# Patient Record
Sex: Female | Born: 1970 | Race: White | Hispanic: No | Smoking: Never smoker
Health system: Southern US, Community
[De-identification: ages and names within clinical notes are randomized; demographics above are authoritative.]

## PROBLEM LIST (undated history)

## (undated) DIAGNOSIS — F32A Depression, unspecified: Secondary | ICD-10-CM

## (undated) DIAGNOSIS — E079 Disorder of thyroid, unspecified: Secondary | ICD-10-CM

## (undated) DIAGNOSIS — G8929 Other chronic pain: Secondary | ICD-10-CM

## (undated) DIAGNOSIS — K219 Gastro-esophageal reflux disease without esophagitis: Secondary | ICD-10-CM

## (undated) DIAGNOSIS — M797 Fibromyalgia: Secondary | ICD-10-CM

## (undated) HISTORY — PX: TONSILLECTOMY AND ADENOIDECTOMY: SUR1326

## (undated) HISTORY — PX: TUBAL LIGATION: SHX77

## (undated) HISTORY — DX: Fibromyalgia: M79.7

## (undated) HISTORY — DX: Other chronic pain: G89.29

## (undated) HISTORY — DX: Depression, unspecified: F32.A

## (undated) HISTORY — DX: Disorder of thyroid, unspecified: E07.9

## (undated) HISTORY — PX: INNER EAR SURGERY: SHX679

## (undated) HISTORY — DX: Gastro-esophageal reflux disease without esophagitis: K21.9

---

## 2005-12-13 ENCOUNTER — Ambulatory Visit: Payer: Self-pay | Admitting: Pediatrics

## 2006-08-19 ENCOUNTER — Other Ambulatory Visit: Payer: Self-pay

## 2006-08-29 ENCOUNTER — Ambulatory Visit: Payer: Self-pay | Admitting: Unknown Physician Specialty

## 2006-09-22 ENCOUNTER — Ambulatory Visit: Payer: Self-pay | Admitting: Rheumatology

## 2008-11-21 ENCOUNTER — Ambulatory Visit: Payer: Self-pay | Admitting: Obstetrics and Gynecology

## 2012-02-26 DIAGNOSIS — G4700 Insomnia, unspecified: Secondary | ICD-10-CM | POA: Insufficient documentation

## 2012-02-26 DIAGNOSIS — M542 Cervicalgia: Secondary | ICD-10-CM

## 2012-02-26 DIAGNOSIS — G609 Hereditary and idiopathic neuropathy, unspecified: Secondary | ICD-10-CM | POA: Insufficient documentation

## 2012-02-26 DIAGNOSIS — G43909 Migraine, unspecified, not intractable, without status migrainosus: Secondary | ICD-10-CM

## 2012-02-26 HISTORY — DX: Migraine, unspecified, not intractable, without status migrainosus: G43.909

## 2012-02-26 HISTORY — DX: Insomnia, unspecified: G47.00

## 2012-02-26 HISTORY — DX: Cervicalgia: M54.2

## 2012-02-26 HISTORY — DX: Hereditary and idiopathic neuropathy, unspecified: G60.9

## 2014-03-03 DIAGNOSIS — I10 Essential (primary) hypertension: Secondary | ICD-10-CM | POA: Insufficient documentation

## 2014-03-03 HISTORY — DX: Essential (primary) hypertension: I10

## 2014-09-09 DIAGNOSIS — I639 Cerebral infarction, unspecified: Secondary | ICD-10-CM

## 2014-09-09 HISTORY — DX: Cerebral infarction, unspecified: I63.9

## 2019-12-23 ENCOUNTER — Encounter: Payer: Self-pay | Admitting: General Practice

## 2020-01-13 ENCOUNTER — Other Ambulatory Visit: Payer: Self-pay

## 2020-01-13 ENCOUNTER — Encounter: Payer: Self-pay | Admitting: Cardiology

## 2020-01-13 ENCOUNTER — Ambulatory Visit (INDEPENDENT_AMBULATORY_CARE_PROVIDER_SITE_OTHER): Payer: Medicaid Other | Admitting: Cardiology

## 2020-01-13 VITALS — BP 100/70 | HR 106 | Ht 62.5 in | Wt 160.0 lb

## 2020-01-13 DIAGNOSIS — I10 Essential (primary) hypertension: Secondary | ICD-10-CM | POA: Diagnosis not present

## 2020-01-13 DIAGNOSIS — R42 Dizziness and giddiness: Secondary | ICD-10-CM

## 2020-01-13 DIAGNOSIS — E0800 Diabetes mellitus due to underlying condition with hyperosmolarity without nonketotic hyperglycemic-hyperosmolar coma (NKHHC): Secondary | ICD-10-CM

## 2020-01-13 DIAGNOSIS — E119 Type 2 diabetes mellitus without complications: Secondary | ICD-10-CM | POA: Diagnosis not present

## 2020-01-13 DIAGNOSIS — R079 Chest pain, unspecified: Secondary | ICD-10-CM

## 2020-01-13 DIAGNOSIS — R0602 Shortness of breath: Secondary | ICD-10-CM

## 2020-01-13 DIAGNOSIS — E782 Mixed hyperlipidemia: Secondary | ICD-10-CM

## 2020-01-13 DIAGNOSIS — R002 Palpitations: Secondary | ICD-10-CM

## 2020-01-13 DIAGNOSIS — I200 Unstable angina: Secondary | ICD-10-CM | POA: Diagnosis not present

## 2020-01-13 HISTORY — DX: Mixed hyperlipidemia: E78.2

## 2020-01-13 HISTORY — DX: Dizziness and giddiness: R42

## 2020-01-13 HISTORY — DX: Diabetes mellitus due to underlying condition with hyperosmolarity without nonketotic hyperglycemic-hyperosmolar coma (NKHHC): E08.00

## 2020-01-13 HISTORY — DX: Chest pain, unspecified: R07.9

## 2020-01-13 HISTORY — DX: Palpitations: R00.2

## 2020-01-13 HISTORY — DX: Shortness of breath: R06.02

## 2020-01-13 MED ORDER — NITROGLYCERIN 0.4 MG SL SUBL
0.4000 mg | SUBLINGUAL_TABLET | SUBLINGUAL | 3 refills | Status: DC | PRN
Start: 2020-01-13 — End: 2021-03-02

## 2020-01-13 NOTE — Progress Notes (Signed)
Cardiology Office Note:    Date:  01/13/2020   ID:  Alexis Baker, DOB 08-12-1971, MRN [id number]  PCP:  [facility]  Cardiologist:  Thomasene Ripple, DO  Electrophysiologist:  None   Referring MD: [facility]*   The patient is referred by her PCP  History of Present Illness:    Alexis Baker is a 49 y.o. female with a hx of hypertension, diabetes, hyperlipidemia family history of coronary artery disease and anxiety.  She does have difficulty hearing.  Patient tells me that over the last 6 months she has been experiencing intermittent chest pain.  She described as a left-sided chest heaviness.  She notes that is usually about a 5 out of 10 at its worse.  At times it is tall and sometimes it feels achy.  She does feels that there are intermittent times where she feels some radiation to her upper shoulder not all the time.  She admits to associated shortness of breath with this. She also notes that recently she has some dizzy spells where she felt like she was going to pass out.  For this her PCP put a monitor on her which was sent back and the results has been at her PCPs office.  She noted that he states his feelings were associated with worsening palpitations.  She described the palpitations as an abrupt onset of fast heartbeat would last for few seconds.  Nothing made it better or worse.  Tells me her palpitations started back in 2007 when she had her son C-section and experience significant flash pulmonary edema.  Past Medical History:  Diagnosis Date  . Essential hypertension 03/03/2014  . Hereditary and idiopathic peripheral neuropathy 02/26/2012  . Insomnia 02/26/2012  . Migraine, unspecified, not intractable, without status migrainosus 02/26/2012  . Neck pain 02/26/2012    Past Surgical History:  Procedure Laterality Date  . INNER EAR SURGERY     Tubes placed in right  . TONSILLECTOMY AND ADENOIDECTOMY    . TUBAL LIGATION      Current  Medications: Current Meds  Medication Sig  . atorvastatin (LIPITOR) 80 MG tablet 80 mg daily.  . carboxymethylcellulose (REFRESH PLUS) 0.5 % SOLN 1 drop 3 times daily.  . Cholecalciferol (VITAMIN D3) 25 MCG (1000 UT) CAPS Take by mouth daily.  . clonazePAM (KLONOPIN) 1 MG tablet Take by mouth as needed.  . cyclobenzaprine (FLEXERIL) 10 MG tablet Take 10 mg by mouth at bedtime.  . DULoxetine (CYMBALTA) 30 MG capsule Take 30 mg by mouth daily.  Marland Kitchen glipiZIDE (GLUCOTROL XL) 5 MG 24 hr tablet 5 mg daily.  . hydrOXYzine (ATARAX/VISTARIL) 25 MG tablet 25 mg at bedtime.  . insulin NPH-regular Human (70-30) 100 UNIT/ML injection Inject into the skin.  Marland Kitchen levothyroxine (SYNTHROID) 25 MCG tablet 25 mcg daily.  Marland Kitchen losartan (COZAAR) 50 MG tablet 50 mg daily.  . metFORMIN (GLUCOPHAGE-XR) 500 MG 24 hr tablet 1,000 mg in the morning and at bedtime.  . mometasone (NASONEX) 50 MCG/ACT nasal spray Place 2 sprays into the nose daily.  . nortriptyline (PAMELOR) 50 MG capsule 100 mg at bedtime.  Marland Kitchen omeprazole (PRILOSEC) 40 MG capsule 40 mg as needed.  . sertraline (ZOLOFT) 100 MG tablet Take 100 mg by mouth daily.  . traZODone (DESYREL) 50 MG tablet Take 100 mg by mouth at bedtime.     Allergies:   Penicillins   Social History   Socioeconomic History  . Marital status: Married  Spouse name: Not on file  . Number of children: Not on file  . Years of education: Not on file  . Highest education level: Not on file  Occupational History  . Not on file  Tobacco Use  . Smoking status: Never Smoker  . Smokeless tobacco: Never Used  Substance and Sexual Activity  . Alcohol use: Never  . Drug use: Never  . Sexual activity: Not on file  Other Topics Concern  . Not on file  Social History Narrative  . Not on file   Social Determinants of Health   Financial Resource Strain:   . Difficulty of Paying Living Expenses:   Food Insecurity:   . Worried About Programme researcher, broadcasting/film/video in the Last Year:   . Garment/textile technologist in the Last Year:   Transportation Needs:   . Freight forwarder (Medical):   Marland Kitchen Lack of Transportation (Non-Medical):   Physical Activity:   . Days of Exercise per Week:   . Minutes of Exercise per Session:   Stress:   . Feeling of Stress :   Social Connections:   . Frequency of Communication with Friends and Family:   . Frequency of Social Gatherings with Friends and Family:   . Attends Religious Services:   . Active Member of Clubs or Organizations:   . Attends Banker Meetings:   Marland Kitchen Marital Status:      Family History: The patient's family history includes Congestive Heart Failure in her brother; Dementia in her paternal grandmother; Diabetes in her brother, father, maternal grandmother, and paternal grandmother; Heart attack in her maternal grandmother; Heart disease in her father.  ROS:   Review of Systems  Constitution: Negative for decreased appetite, fever and weight gain.  HENT: Negative for congestion, ear discharge, hoarse voice and sore throat.   Eyes: Negative for discharge, redness, vision loss in right eye and visual halos.  Cardiovascular: Reports chest pain and dyspnea on exertion.  Negative for leg swelling, orthopnea and palpitations.  Respiratory: Negative for cough, hemoptysis, shortness of breath and snoring.   Endocrine: Negative for heat intolerance and polyphagia.  Hematologic/Lymphatic: Negative for bleeding problem. Does not bruise/bleed easily.  Skin: Negative for flushing, nail changes, rash and suspicious lesions.  Musculoskeletal: Negative for arthritis, joint pain, muscle cramps, myalgias, neck pain and stiffness.  Gastrointestinal: Negative for abdominal pain, bowel incontinence, diarrhea and excessive appetite.  Genitourinary: Negative for decreased libido, genital sores and incomplete emptying.  Neurological: Negative for brief paralysis, focal weakness, headaches and loss of balance.  Psychiatric/Behavioral: Negative for  altered mental status, depression and suicidal ideas.  Allergic/Immunologic: Negative for HIV exposure and persistent infections.    EKGs/Labs/Other Studies Reviewed:    The following studies were reviewed today:   EKG:  The ekg ordered today demonstrates sinus tachycardia, heart rate 102 bpm.  Recent Labs: No results found for requested labs within last 8760 hours.  Recent Lipid Panel No results found for: CHOL, TRIG, HDL, CHOLHDL, VLDL, LDLCALC, LDLDIRECT  Physical Exam:    VS:  BP 100/70 (BP Location: Left Arm, Patient Position: Sitting, Cuff Size: Normal)   Pulse (!) 106   Ht 5' 2.5" (1.588 m)   Wt 160 lb (72.6 kg)   SpO2 97%   BMI 28.80 kg/m     Wt Readings from Last 3 Encounters:  01/13/20 160 lb (72.6 kg)     GEN: Well nourished, well developed in no acute distress HEENT: Normal NECK: No JVD; No carotid  bruits LYMPHATICS: No lymphadenopathy CARDIAC: S1S2 noted,RRR, no murmurs, rubs, gallops RESPIRATORY:  Clear to auscultation without rales, wheezing or rhonchi  ABDOMEN: Soft, non-tender, non-distended, +bowel sounds, no guarding. EXTREMITIES: No edema, No cyanosis, no clubbing MUSCULOSKELETAL:  No deformity  SKIN: Warm and dry NEUROLOGIC:  Alert and oriented x 3, non-focal PSYCHIATRIC:  Normal affect, good insight  ASSESSMENT:    1. Essential hypertension   2. Mixed hyperlipidemia   3. Diabetes mellitus due to underlying condition with hyperosmolarity without coma, without long-term current use of insulin (HCC)   4. Chest pain of uncertain etiology   5. Shortness of breath   6. Palpitations   7. Dizziness    PLAN:    Her chest pain is concerning given her risk factors I like to pursue an ischemic evaluation.  This patient she is agreeable to proceed with a pharmacologic nuclear stress test.  Have educated patient about this test and she is willing to proceed.  In addition, sublingual nitroglycerin prescription was sent, its protocol and 911 protocol  explained and the patient vocalized understanding questions were answered to the patient's satisfaction.  She does also have shortness of breath on exertion.  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  Hypertension-no need to change any medications pressures acceptable in the office.  Diabetes mellitus-her diabetes is being managed by her PCP.  Palpitations and dizziness-she recently wore monitor unfortunately we have asked to have copy sent while the patient is in her visit but we have not received any other fax yet.  At the very end of the visit the patient started experience significant chest pain, therefore EMS were called and patient will be transported to the [facility] emergency department-I like it to be kept overnight and if her troponins are negative x3 will do a nuclear stress test while she is admitted.  The patient is in agreement with the above plan. The patient left the office in stable condition.  The patient will follow up in 3 months or sooner if needed.   Medication Adjustments/Labs and Tests Ordered: Current medicines are reviewed at length with the patient today.  Concerns regarding medicines are outlined above.  Orders Placed This Encounter  Procedures  . Lipid Profile  . Myocardial Perfusion Imaging  . EKG 12-Lead  . ECHOCARDIOGRAM COMPLETE   Meds ordered this encounter  Medications  . nitroGLYCERIN (NITROSTAT) 0.4 MG SL tablet    Sig: Place 1 tablet (0.4 mg total) under the tongue every 5 (five) minutes as needed for chest pain.    Dispense:  90 tablet    Refill:  3    Patient Instructions  Medication Instructions:  Pick up Nitro  *If you need a refill on your cardiac medications before your next appointment, please call your pharmacy*   Lab Work: Lipids... fasting tomorrow  If you have labs (blood  work) drawn today and your tests are completely normal, you will receive your results only by: Marland Kitchen MyChart Message (if you have MyChart) OR . A paper copy in the mail If you have any lab test that is abnormal or we need to change your treatment, we will call you to review the results.   Testing/Procedures: Your physician has requested that you have an echocardiogram. Echocardiography is a painless test that uses sound waves to create images of your heart. It provides your doctor with information about  the size and shape of your heart and how well your heart's chambers and valves are working. This procedure takes approximately one hour. There are no restrictions for this procedure.  .        Follow-Up: At [facility], you and your health needs are our priority.  As part of our continuing mission to provide you with exceptional heart care, we have created designated Provider Care Teams.  These Care Teams include your primary Cardiologist (physician) and Advanced Practice Providers (APPs -  Physician Assistants and Nurse Practitioners) who all work together to provide you with the care you need, when you need it.  We recommend signing up for the patient portal called "MyChart".  Sign up information is provided on this After Visit Summary.  MyChart is used to connect with patients for Virtual Visits (Telemedicine).  Patients are able to view lab/test results, encounter notes, upcoming appointments, etc.  Non-urgent messages can be sent to your provider as well.   To learn more about what you can do with MyChart, go to [email or web address].    Your next appointment:   3 month(s)  The format for your next appointment:   In Person  Provider:   Berniece Salines, DO   Other Instructions      Adopting a Healthy Lifestyle.  Know what a healthy weight is for you (roughly BMI <25) and aim to maintain this   Aim for 7+ servings of fruits and vegetables daily   65-80+ fluid ounces of water  or unsweet tea for healthy kidneys   Limit to max 1 drink of alcohol per day; avoid smoking/tobacco   Limit animal fats in diet for cholesterol and heart health - choose grass fed whenever available   Avoid highly processed foods, and foods high in saturated/trans fats   Aim for low stress - take time to unwind and care for your mental health   Aim for 150 min of moderate intensity exercise weekly for heart health, and weights twice weekly for bone health   Aim for 7-9 hours of sleep daily   When it comes to diets, agreement about the perfect plan isnt easy to find, even among the experts. Experts at the [employer] developed an idea known as the Healthy Eating Plate. Just imagine a plate divided into logical, healthy portions.   The emphasis is on diet quality:   Load up on vegetables and fruits - one-half of your plate: Aim for color and variety, and remember that potatoes dont count.   Go for whole grains - one-quarter of your plate: Whole wheat, barley, wheat berries, quinoa, oats, brown rice, and foods made with them. If you want pasta, go with whole wheat pasta.   Protein power - one-quarter of your plate: Fish, chicken, beans, and nuts are all healthy, versatile protein sources. Limit red meat.   The diet, however, does go beyond the plate, offering a few other suggestions.   Use healthy plant oils, such as olive, canola, soy, corn, sunflower and peanut. Check the labels, and avoid partially hydrogenated oil, which have unhealthy trans fats.   If youre thirsty, drink water. Coffee and tea are good in moderation, but skip sugary drinks and limit milk and dairy products to one or two daily servings.   The type of carbohydrate in the diet is more important than the amount. Some sources of carbohydrates, such as vegetables, fruits, whole grains, and beans-are healthier than others.   Finally, stay active  Signed,  Thomasene RippleKardie Lavin Petteway, DO  01/13/2020 4:44 PM      [facility]

## 2020-01-14 DIAGNOSIS — I200 Unstable angina: Secondary | ICD-10-CM | POA: Diagnosis not present

## 2020-01-14 DIAGNOSIS — E119 Type 2 diabetes mellitus without complications: Secondary | ICD-10-CM | POA: Diagnosis not present

## 2020-01-14 DIAGNOSIS — R079 Chest pain, unspecified: Secondary | ICD-10-CM | POA: Diagnosis not present

## 2020-01-14 DIAGNOSIS — I10 Essential (primary) hypertension: Secondary | ICD-10-CM | POA: Diagnosis not present

## 2020-02-15 ENCOUNTER — Ambulatory Visit: Payer: Medicaid Other | Admitting: Cardiology

## 2020-09-04 DIAGNOSIS — U071 COVID-19: Secondary | ICD-10-CM | POA: Insufficient documentation

## 2020-09-04 DIAGNOSIS — E669 Obesity, unspecified: Secondary | ICD-10-CM | POA: Insufficient documentation

## 2020-09-04 DIAGNOSIS — R0902 Hypoxemia: Secondary | ICD-10-CM

## 2020-09-04 HISTORY — DX: Hypoxemia: R09.02

## 2020-09-04 HISTORY — DX: Obesity, unspecified: E66.9

## 2020-09-04 HISTORY — DX: COVID-19: U07.1

## 2021-01-31 ENCOUNTER — Ambulatory Visit: Payer: Medicaid Other | Admitting: Internal Medicine

## 2021-01-31 ENCOUNTER — Other Ambulatory Visit (HOSPITAL_COMMUNITY): Payer: Self-pay

## 2021-02-15 ENCOUNTER — Ambulatory Visit (INDEPENDENT_AMBULATORY_CARE_PROVIDER_SITE_OTHER): Payer: Medicaid Other | Admitting: Internal Medicine

## 2021-02-15 ENCOUNTER — Encounter: Payer: Self-pay | Admitting: Internal Medicine

## 2021-02-15 ENCOUNTER — Other Ambulatory Visit: Payer: Self-pay

## 2021-02-15 VITALS — BP 136/86 | HR 87 | Resp 16 | Ht 62.5 in | Wt 158.0 lb

## 2021-02-15 DIAGNOSIS — J329 Chronic sinusitis, unspecified: Secondary | ICD-10-CM | POA: Diagnosis not present

## 2021-02-15 NOTE — Progress Notes (Signed)
[facility]  Reason for Consult: "chronic infection" Referring Provider: Adela Glimpse    Patient Active Problem List   Diagnosis Date Noted   Mixed hyperlipidemia 01/13/2020   Diabetes mellitus due to underlying condition with hyperosmolarity without coma, without long-term current use of insulin (HCC) 01/13/2020   Chest pain of uncertain etiology 01/13/2020   Palpitations 01/13/2020   Dizziness 01/13/2020   Shortness of breath 01/13/2020   Essential hypertension 03/03/2014   Hereditary and idiopathic peripheral neuropathy 02/26/2012   Insomnia 02/26/2012   Migraine, unspecified, not intractable, without status migrainosus 02/26/2012   Neck pain 02/26/2012      HPI: Alexis Baker is a 50 y.o. female allergic rhinitis, hx covid infection, hlp, dm2, migraine, depression, referred by her pcp for chronic infection  I reviewed Tonya's (her pcp note), and main ID issue was chronic diarrhea that had negative cdiff pcr Today 02/15/2021 patient is not referring about any diarrhea. Last time she has diarrhea was a week prior to this visit. Had had diarrhea since covid infection and described it as soft/broken up/watery. She does have solid stools in between  She is focusing today on recurrent sinus infection since 1980. I asked her to describe an episode: Headache between eyes, facial pain in the cheeks/upper teeth, nasal discharge "all different colors", ears pressure, most of the time with fever 100.3s to sometimes 101s  She would have an episode every 2-3 months  An episode would last about 2 months, then the next 3-4 weeks she would get another one  She had seen ENT and her PCP for this She has tympanostomy on the right side; previously left side too   Patient has a 79 year old kid  Pretty much each time they would give her antibiotics Patient hasn't taken any antibiotics since covid.   She used to use ocean spray She doesn't recall using  anticholinergic or steroid inhaler  She had seen allergy specialty before but said she would get sinus symptoms with each injection. She had allergy testing before as well and is allergic to dog/cats as well. Currently not seeing them    She had covid infection 08/2020 and was admitted for 3 days put on oxygen. She has sequela post covid infection and has chronic wheezing/short of breath/dry cough and is following a pulmonologist. Not needing chronic oxygen. Using 2 inhalers   No history of pneumonia growing up until covid She is diabetic and has had some uti episodes (a lot initially until she was diagnosed with diabetes and treated and improved episodes).   She also mentions she has rheumatoid arthritis but never took medication. But this is unclear   Have lost 12 pounds the last 6 weeks, but gained 4 pounds the past 2 weeks Appetite is great and eats well.  Reports nightsweat on-off for the past 1-2 years; still menstruating but irregularly  Id epi: Born/raised in [place] No smoking/drugs Pet dog for "all her life" --  No travel outside of [place]  Family history: Coronary artery disease, dm2, no immunodeficiency/recurrent infection No primary recurrent fever disease  No hematologic malignancy     Review of Systems: ROS All other ros negative      Past Medical History:  Diagnosis Date   Chest pain of uncertain etiology 01/13/2020   Diabetes mellitus due to underlying condition with hyperosmolarity without coma, without long-term current use of insulin (HCC) 01/13/2020   Dizziness 01/13/2020   Essential  hypertension 03/03/2014   Hereditary and idiopathic peripheral neuropathy 02/26/2012   Insomnia 02/26/2012   Migraine, unspecified, not intractable, without status migrainosus 02/26/2012   Mixed hyperlipidemia 01/13/2020   Neck pain 02/26/2012   Palpitations 01/13/2020   Shortness of breath 01/13/2020    Social History   Tobacco Use   Smoking status:  Never   Smokeless tobacco: Never  Substance Use Topics   Alcohol use: Never   Drug use: Never    Family History  Problem Relation Age of Onset   Diabetes Father    Heart disease Father    Diabetes Brother    Congestive Heart Failure Brother    Heart attack Maternal Grandmother    Diabetes Maternal Grandmother    Diabetes Paternal Grandmother    Dementia Paternal Grandmother     Allergies  Allergen Reactions   Penicillins Rash    OBJECTIVE: Vitals:   02/15/21 1529  BP: 136/86  Pulse: 87  Resp: 16  SpO2: 98%  Weight: 158 lb (71.7 kg)  Height: 5' 2.5" (1.588 m)   Body mass index is 28.44 kg/m.   Physical Exam General/constitutional: no distress, pleasant; very hard at hearing HEENT: Normocephalic, PER, Conj Clear, EOMI, Oropharynx clear; nasal mucosa boggy Neck supple CV: rrr no mrg Lungs: clear to auscultation, normal respiratory effort Abd: Soft, Nontender Ext: no edema Skin: No Rash Neuro: nonfocal MSK: no peripheral joint swelling/tenderness/warmth; back spines nontender Psych: alert/oriented   Lab:  Microbiology:  Serology: 01/2021 cdiff pcr negative   Imaging:   Assessment/plan: Problem List Items Addressed This Visit   None Visit Diagnoses     Sinusitis, unspecified chronicity, unspecified location    -  Primary        I do not hear a story of concerning recurrent bacterial infection or suggestion of primary immunodeficiency  Her recent sweating episodes I query if could be menopausal  She has had diarrhea since covid but seems to be improving  She has what seems to be complication of atopic triad and eutaschian tube dysfunction. 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   I spent 60 minute reviewing data/chart, and coordinating care and providing counseling/discussing diagnostics/treatment plan with patient  Raymondo Band, MD [facility] [phone] pager   [phone] cell 02/15/2021, 3:45 PM

## 2021-02-15 NOTE — Patient Instructions (Signed)
Please sign release of information for records from your allergist/immunologist and ENT  I would like to see their initial H&P, last 3 progress note, CT scan of the head/sinus, prior labs  Avoid antibiotics for sinus symptoms. Discuss with allergy/immunology your sinus symptoms and see if they worry about primary immunodeficiency syndrome such as CVID (common variable immune deficiency)  I would focus on nasal inhaler for antiinflammatory and decongestants   Follow up with me in 6-8 weeks so we can see if we need to do anything else from infectious standpoint for you  Thank you

## 2021-03-02 ENCOUNTER — Other Ambulatory Visit: Payer: Self-pay | Admitting: Cardiology

## 2021-03-02 NOTE — Telephone Encounter (Signed)
Rx sent with instruction to arrange an appt for further refills.

## 2021-04-11 ENCOUNTER — Encounter: Payer: Self-pay | Admitting: Cardiology

## 2021-04-11 ENCOUNTER — Ambulatory Visit (INDEPENDENT_AMBULATORY_CARE_PROVIDER_SITE_OTHER): Payer: Medicaid Other

## 2021-04-11 ENCOUNTER — Other Ambulatory Visit: Payer: Self-pay

## 2021-04-11 ENCOUNTER — Ambulatory Visit (INDEPENDENT_AMBULATORY_CARE_PROVIDER_SITE_OTHER): Payer: Medicaid Other | Admitting: Cardiology

## 2021-04-11 VITALS — BP 108/70 | HR 64 | Ht 62.5 in | Wt 156.1 lb

## 2021-04-11 DIAGNOSIS — Z8616 Personal history of COVID-19: Secondary | ICD-10-CM

## 2021-04-11 DIAGNOSIS — R55 Syncope and collapse: Secondary | ICD-10-CM | POA: Diagnosis not present

## 2021-04-11 DIAGNOSIS — R002 Palpitations: Secondary | ICD-10-CM | POA: Diagnosis not present

## 2021-04-11 DIAGNOSIS — E119 Type 2 diabetes mellitus without complications: Secondary | ICD-10-CM

## 2021-04-11 DIAGNOSIS — R0602 Shortness of breath: Secondary | ICD-10-CM

## 2021-04-11 DIAGNOSIS — R42 Dizziness and giddiness: Secondary | ICD-10-CM | POA: Diagnosis not present

## 2021-04-11 DIAGNOSIS — E782 Mixed hyperlipidemia: Secondary | ICD-10-CM

## 2021-04-11 DIAGNOSIS — Z794 Long term (current) use of insulin: Secondary | ICD-10-CM

## 2021-04-11 DIAGNOSIS — I10 Essential (primary) hypertension: Secondary | ICD-10-CM

## 2021-04-11 NOTE — Patient Instructions (Signed)
Medication Instructions:  Your physician recommends that you continue on your current medications as directed. Please refer to the Current Medication list given to you today.  *If you need a refill on your cardiac medications before your next appointment, please call your pharmacy*   Lab Work: None If you have labs (blood work) drawn today and your tests are completely normal, you will receive your results only by: MyChart Message (if you have MyChart) OR A paper copy in the mail If you have any lab test that is abnormal or we need to change your treatment, we will call you to review the results.   Testing/Procedures: Your physician has requested that you have an echocardiogram. Echocardiography is a painless test that uses sound waves to create images of your heart. It provides your doctor with information about the size and shape of your heart and how well your heart's chambers and valves are working. This procedure takes approximately one hour. There are no restrictions for this procedure.  A zio monitor was ordered today. It will remain on for 14 days. You will then return monitor and event diary in provided box. It takes 1-2 weeks for report to be downloaded and returned to [place]. We will call you with the results. If monitor falls off or has orange flashing light, please call Zio for further instructions.     Follow-Up: At [facility], you and your health needs are our priority.  As part of our continuing mission to provide you with exceptional heart care, we have created designated Provider Care Teams.  These Care Teams include your primary Cardiologist (physician) and Advanced Practice Providers (APPs -  Physician Assistants and Nurse Practitioners) who all work together to provide you with the care you need, when you need it.  We recommend signing up for the patient portal called "MyChart".  Sign up information is provided on this After Visit Summary.  MyChart is used to connect with  patients for Virtual Visits (Telemedicine).  Patients are able to view lab/test results, encounter notes, upcoming appointments, etc.  Non-urgent messages can be sent to your provider as well.   To learn more about what you can do with MyChart, go to [email or web address].    Your next appointment:   8 week(s)  The format for your next appointment:   Virtual Visit   Provider:   Thomasene Ripple, DO   Other Instructions ZIO  WHY IS MY DOCTOR PRESCRIBING ZIO? The Zio system is proven and trusted by physicians to detect and diagnose irregular heart rhythms -- and has been prescribed to hundreds of thousands of patients.  The FDA has cleared the Zio system to monitor for many different kinds of irregular heart rhythms. In a study, physicians were able to reach a diagnosis 90% of the time with the Zio system1.  You can wear the Zio monitor -- a small, discreet, comfortable patch -- during your normal day-to-day activity, including while you sleep, shower, and exercise, while it records every single heartbeat for analysis.  1Barrett, P., et al. Comparison of 24 Hour Holter Monitoring Versus 14 Day Novel Adhesive Patch Electrocardiographic Monitoring. American Journal of Medicine, 2014.  ZIO VS. HOLTER MONITORING The Zio monitor can be comfortably worn for up to 14 days. Holter monitors can be worn for 24 to 48 hours, limiting the time to record any irregular heart rhythms you may have. Zio is able to capture data for the 51% of patients who have their first symptom-triggered arrhythmia after 48 hours.1  LIVE WITHOUT RESTRICTIONS The Zio ambulatory cardiac monitor is a small, unobtrusive, and water-resistant patch--you might even forget you're wearing it. The Zio monitor records and stores every beat of your heart, whether you're sleeping, working out, or showering. Remove 14 days after application.   Echocardiogram An echocardiogram is a test that uses sound waves (ultrasound) to produce  images of the heart. Images from an echocardiogram can provide important information about: Heart size and shape. The size and thickness and movement of your heart's walls. Heart muscle function and strength. Heart valve function or if you have stenosis. Stenosis is when the heart valves are too narrow. If blood is flowing backward through the heart valves (regurgitation). A tumor or infectious growth around the heart valves. Areas of heart muscle that are not working well because of poor blood flow or injury from a heart attack. Aneurysm detection. An aneurysm is a weak or damaged part of an artery wall. The wall bulges out from the normal force of blood pumping through the body. Tell a health care provider about: Any allergies you have. All medicines you are taking, including vitamins, herbs, eye drops, creams, and over-the-counter medicines. Any blood disorders you have. Any surgeries you have had. Any medical conditions you have. Whether you are pregnant or may be pregnant. What are the risks? Generally, this is a safe test. However, problems may occur, including an allergic reaction to dye (contrast) that may be used during the test. What happens before the test? No specific preparation is needed. You may eat and drink normally. What happens during the test?  You will take off your clothes from the waist up and put on a hospital gown. Electrodes or electrocardiogram (ECG)patches may be placed on your chest. The electrodes or patches are then connected to a device that monitors your heart rate and rhythm. You will lie down on a table for an ultrasound exam. A gel will be applied to your chest to help sound waves pass through your skin. A handheld device, called a transducer, will be pressed against your chest and moved over your heart. The transducer produces sound waves that travel to your heart and bounce back (or "echo" back) to the transducer. These sound waves will be captured in  real-time and changed into images of your heart that can be viewed on a video monitor. The images will be recorded on a computer and reviewed by your health care provider. You may be asked to change positions or hold your breath for a short time. This makes it easier to get different views or better views of your heart. In some cases, you may receive contrast through an IV in one of your veins. This can improve the quality of the pictures from your heart. The procedure may vary among health care providers and hospitals. What can I expect after the test? You may return to your normal, everyday life, including diet, activities, andmedicines, unless your health care provider tells you not to do that. Follow these instructions at home: It is up to you to get the results of your test. Ask your health care provider, or the department that is doing the test, when your results will be ready. Keep all follow-up visits. This is important. Summary An echocardiogram is a test that uses sound waves (ultrasound) to produce images of the heart. Images from an echocardiogram can provide important information about the size and shape of your heart, heart muscle function, heart valve function, and other possible heart problems.  You do not need to do anything to prepare before this test. You may eat and drink normally. After the echocardiogram is completed, you may return to your normal, everyday life, unless your health care provider tells you not to do that. This information is not intended to replace advice given to you by your health care provider. Make sure you discuss any questions you have with your healthcare provider. Document Revised: 04/18/2020 Document Reviewed: 04/18/2020 Elsevier Patient Education  2022 [employer].

## 2021-04-12 ENCOUNTER — Ambulatory Visit: Payer: Medicaid Other | Admitting: Internal Medicine

## 2021-04-12 NOTE — Progress Notes (Signed)
Cardiology Office Note:    Date:  04/12/2021   ID:  Alexis Baker, DOB 05/27/1971, MRN [id number]  PCP:  Adela Glimpse, NP  Cardiologist:  Thomasene Ripple, DO  Electrophysiologist:  None   Referring MD: [facility]*   " I am having shortness of breath"   History of Present Illness:    Alexis Baker is a 50 y.o. female with a hx of hypertension, diabetes mellitus, hyperlipidemia, is here today for follow-up visit.  The patient tells me since last time I saw her she has contracted COVID-19 infection and has been having worsening shortness of breath. Of note since her last visit she was admitted to [facility] over the last year and was worked up with no evidence of coronary artery disease and calcium score was reported to be 0 as well.  Her biggest problem today is the fact that she is significantly short of breath and exertion.  And she notes that since her infection she has had worsening palpitations.  Past Medical History:  Diagnosis Date   Chest pain of uncertain etiology 01/13/2020   Diabetes mellitus due to underlying condition with hyperosmolarity without coma, without long-term current use of insulin (HCC) 01/13/2020   Dizziness 01/13/2020   Essential hypertension 03/03/2014   Hereditary and idiopathic peripheral neuropathy 02/26/2012   Insomnia 02/26/2012   Migraine, unspecified, not intractable, without status migrainosus 02/26/2012   Mixed hyperlipidemia 01/13/2020   Neck pain 02/26/2012   Palpitations 01/13/2020   Shortness of breath 01/13/2020    Past Surgical History:  Procedure Laterality Date   INNER EAR SURGERY     Tubes placed in right   TONSILLECTOMY AND ADENOIDECTOMY     TUBAL LIGATION      Current Medications: Current Meds  Medication Sig   aspirin 81 MG EC tablet Take by mouth.   atorvastatin (LIPITOR) 80 MG tablet 80 mg daily.   BIOTIN 5000 PO Take by mouth.   carboxymethylcellulose (REFRESH PLUS) 0.5 % SOLN 1 drop 3 times daily.   cyclobenzaprine  (FLEXERIL) 10 MG tablet Take 10 mg by mouth at bedtime.   ELDERBERRY PO Take 630 mg by mouth.   FARXIGA 10 MG TABS tablet Take 10 mg by mouth daily.   FLUoxetine (PROZAC) 10 MG capsule Take 10 mg by mouth daily.   Fluticasone-Umeclidin-Vilant (TRELEGY ELLIPTA) 200-62.5-25 MCG/INH AEPB Inhale 1 puff into the lungs daily.   hydrOXYzine (ATARAX/VISTARIL) 25 MG tablet 25 mg at bedtime.   insulin aspart (NOVOLOG) 100 UNIT/ML injection Inject 26 Units into the skin 3 (three) times daily before meals.   insulin NPH-regular Human (70-30) 100 UNIT/ML injection Inject into the skin.   levothyroxine (SYNTHROID) 25 MCG tablet 25 mcg daily.   losartan (COZAAR) 50 MG tablet 50 mg daily.   mometasone (NASONEX) 50 MCG/ACT nasal spray Place 2 sprays into the nose daily.   montelukast (SINGULAIR) 10 MG tablet Take 10 mg by mouth at bedtime.   nitroGLYCERIN (NITROSTAT) 0.4 MG SL tablet PLACE 1 TABLET UNDER THE TONGUE EVERY 5 MINUTES AS NEEDED FOR CHEST PAIN.   nortriptyline (PAMELOR) 50 MG capsule 100 mg at bedtime.   omeprazole (PRILOSEC) 40 MG capsule 40 mg as needed.   propranolol (INNOPRAN XL) 120 MG 24 hr capsule Take 1 capsule by mouth daily.   traZODone (DESYREL) 50 MG tablet Take 100 mg by mouth at bedtime.   Zinc 50 MG TABS Take by mouth.     Allergies:   Penicillins   Social History  Socioeconomic History   Marital status: Legally Separated    Spouse name: Not on file   Number of children: Not on file   Years of education: Not on file   Highest education level: Not on file  Occupational History   Not on file  Tobacco Use   Smoking status: Never   Smokeless tobacco: Never  Substance and Sexual Activity   Alcohol use: Never   Drug use: Never   Sexual activity: Not on file  Other Topics Concern   Not on file  Social History Narrative   Not on file   Social Determinants of Health   Financial Resource Strain: Not on file  Food Insecurity: Not on file  Transportation Needs: Not on  file  Physical Activity: Not on file  Stress: Not on file  Social Connections: Not on file     Family History: The patient's family history includes Congestive Heart Failure in her brother; Dementia in her paternal grandmother; Diabetes in her brother, father, maternal grandmother, and paternal grandmother; Heart attack in her maternal grandmother; Heart disease in her father.  ROS:   Review of Systems  Constitution: Negative for decreased appetite, fever and weight gain.  HENT: Negative for congestion, ear discharge, hoarse voice and sore throat.   Eyes: Negative for discharge, redness, vision loss in right eye and visual halos.  Cardiovascular: Reports shortness of breath and exertion and palpitations.  Negative for chest pain, leg swelling, orthopnea Respiratory: Negative for cough, hemoptysis, shortness of breath and snoring.   Endocrine: Negative for heat intolerance and polyphagia.  Hematologic/Lymphatic: Negative for bleeding problem. Does not bruise/bleed easily.  Skin: Negative for flushing, nail changes, rash and suspicious lesions.  Musculoskeletal: Negative for arthritis, joint pain, muscle cramps, myalgias, neck pain and stiffness.  Gastrointestinal: Negative for abdominal pain, bowel incontinence, diarrhea and excessive appetite.  Genitourinary: Negative for decreased libido, genital sores and incomplete emptying.  Neurological: Negative for brief paralysis, focal weakness, headaches and loss of balance.  Psychiatric/Behavioral: Negative for altered mental status, depression and suicidal ideas.  Allergic/Immunologic: Negative for HIV exposure and persistent infections.    EKGs/Labs/Other Studies Reviewed:    The following studies were reviewed today:   EKG: None today  Recent Labs: No results found for requested labs within last 8760 hours.  Recent Lipid Panel No results found for: CHOL, TRIG, HDL, CHOLHDL, VLDL, LDLCALC, LDLDIRECT  Physical Exam:    VS:  BP  108/70 (BP Location: Right Arm, Patient Position: Sitting, Cuff Size: Normal)   Pulse 64   Ht 5' 2.5" (1.588 m)   Wt 156 lb 1.3 oz (70.8 kg)   SpO2 98%   BMI 28.09 kg/m     Wt Readings from Last 3 Encounters:  04/11/21 156 lb 1.3 oz (70.8 kg)  02/15/21 158 lb (71.7 kg)  01/13/20 160 lb (72.6 kg)     GEN: Well nourished, well developed in no acute distress HEENT: Normal NECK: No JVD; No carotid bruits LYMPHATICS: No lymphadenopathy CARDIAC: S1S2 noted,RRR, no murmurs, rubs, gallops RESPIRATORY:  Clear to auscultation without rales, wheezing or rhonchi  ABDOMEN: Soft, non-tender, non-distended, +bowel sounds, no guarding. EXTREMITIES: No edema, No cyanosis, no clubbing MUSCULOSKELETAL:  No deformity  SKIN: Warm and dry NEUROLOGIC:  Alert and oriented x 3, non-focal PSYCHIATRIC:  Normal affect, good insight  ASSESSMENT:    1. SOB (shortness of breath)   2. Palpitations   3. History of COVID-19   4. Postural dizziness with presyncope   5. Insulin-requiring or  dependent type II diabetes mellitus (HCC)   6. Essential hypertension   7. Mixed hyperlipidemia    PLAN:      Were going to get a ZIO monitor placed on the patient to make sure that cardiac arrhythmia is not playing a role here.  Given the worsening shortness of breath and history of recent COVID-19 infection I will like to reassess her LV function. Diabetes mellitus-this is being managed by his primary care doctor.  No adjustments for antidiabetic medications were made today.  Blood pressure is acceptable, continue with current antihypertensive regimen.  Hyperlipidemia - continue with current statin medication.  The patient is in agreement with the above plan. The patient left the office in stable condition.  The patient will follow up in   Medication Adjustments/Labs and Tests Ordered: Current medicines are reviewed at length with the patient today.  Concerns regarding medicines are outlined above.  Orders  Placed This Encounter  Procedures   LONG TERM MONITOR (3-14 DAYS)   EKG 12-Lead   ECHOCARDIOGRAM COMPLETE   No orders of the defined types were placed in this encounter.   Patient Instructions  Medication Instructions:  Your physician recommends that you continue on your current medications as directed. Please refer to the Current Medication list given to you today.  *If you need a refill on your cardiac medications before your next appointment, please call your pharmacy*   Lab Work: None If you have labs (blood work) drawn today and your tests are completely normal, you will receive your results only by: MyChart Message (if you have MyChart) OR A paper copy in the mail If you have any lab test that is abnormal or we need to change your treatment, we will call you to review the results.   Testing/Procedures: Your physician has requested that you have an echocardiogram. Echocardiography is a painless test that uses sound waves to create images of your heart. It provides your doctor with information about the size and shape of your heart and how well your heart's chambers and valves are working. This procedure takes approximately one hour. There are no restrictions for this procedure.  A zio monitor was ordered today. It will remain on for 14 days. You will then return monitor and event diary in provided box. It takes 1-2 weeks for report to be downloaded and returned to [place]. We will call you with the results. If monitor falls off or has orange flashing light, please call Zio for further instructions.     Follow-Up: At [facility], you and your health needs are our priority.  As part of our continuing mission to provide you with exceptional heart care, we have created designated Provider Care Teams.  These Care Teams include your primary Cardiologist (physician) and Advanced Practice Providers (APPs -  Physician Assistants and Nurse Practitioners) who all work together to provide you  with the care you need, when you need it.  We recommend signing up for the patient portal called "MyChart".  Sign up information is provided on this After Visit Summary.  MyChart is used to connect with patients for Virtual Visits (Telemedicine).  Patients are able to view lab/test results, encounter notes, upcoming appointments, etc.  Non-urgent messages can be sent to your provider as well.   To learn more about what you can do with MyChart, go to [email or web address].    Your next appointment:   8 week(s)  The format for your next appointment:   Virtual Visit   Provider:  Jejuan Scala, DO   Other Instructions ZIO  WHY IS MY DOCTOR PRESCRIBING ZIO? The Zio system is proven and trusted by physicians to detect and diagnose irregular heart rhythms -- and has been prescribed to hundreds of thousands of patients.  The FDA has cleared the Zio system to monitor for many different kinds of irregular heart rhythms. In a study, physicians were able to reach a diagnosis 90% of the time with the Zio system1.  You can wear the Zio monitor -- a small, discreet, comfortable patch -- during your normal day-to-day activity, including while you sleep, shower, and exercise, while it records every single heartbeat for analysis.  1Barrett, P., et al. Comparison of 24 Hour Holter Monitoring Versus 14 Day Novel Adhesive Patch Electrocardiographic Monitoring. American Journal of Medicine, 2014.  ZIO VS. HOLTER MONITORING The Zio monitor can be comfortably worn for up to 14 days. Holter monitors can be worn for 24 to 48 hours, limiting the time to record any irregular heart rhythms you may have. Zio is able to capture data for the 51% of patients who have their first symptom-triggered arrhythmia after 48 hours.1  LIVE WITHOUT RESTRICTIONS The Zio ambulatory cardiac monitor is a small, unobtrusive, and water-resistant patch--you might even forget you're wearing it. The Zio monitor records and stores every  beat of your heart, whether you're sleeping, working out, or showering. Remove 14 days after application.   Echocardiogram An echocardiogram is a test that uses sound waves (ultrasound) to produce images of the heart. Images from an echocardiogram can provide important information about: Heart size and shape. The size and thickness and movement of your heart's walls. Heart muscle function and strength. Heart valve function or if you have stenosis. Stenosis is when the heart valves are too narrow. If blood is flowing backward through the heart valves (regurgitation). A tumor or infectious growth around the heart valves. Areas of heart muscle that are not working well because of poor blood flow or injury from a heart attack. Aneurysm detection. An aneurysm is a weak or damaged part of an artery wall. The wall bulges out from the normal force of blood pumping through the body. Tell a health care provider about: Any allergies you have. All medicines you are taking, including vitamins, herbs, eye drops, creams, and over-the-counter medicines. Any blood disorders you have. Any surgeries you have had. Any medical conditions you have. Whether you are pregnant or may be pregnant. What are the risks? Generally, this is a safe test. However, problems may occur, including an allergic reaction to dye (contrast) that may be used during the test. What happens before the test? No specific preparation is needed. You may eat and drink normally. What happens during the test?  You will take off your clothes from the waist up and put on a hospital gown. Electrodes or electrocardiogram (ECG)patches may be placed on your chest. The electrodes or patches are then connected to a device that monitors your heart rate and rhythm. You will lie down on a table for an ultrasound exam. A gel will be applied to your chest to help sound waves pass through your skin. A handheld device, called a transducer, will be  pressed against your chest and moved over your heart. The transducer produces sound waves that travel to your heart and bounce back (or "echo" back) to the transducer. These sound waves will be captured in real-time and changed into images of your heart that can be viewed on a video monitor. The  images will be recorded on a computer and reviewed by your health care provider. You may be asked to change positions or hold your breath for a short time. This makes it easier to get different views or better views of your heart. In some cases, you may receive contrast through an IV in one of your veins. This can improve the quality of the pictures from your heart. The procedure may vary among health care providers and hospitals. What can I expect after the test? You may return to your normal, everyday life, including diet, activities, andmedicines, unless your health care provider tells you not to do that. Follow these instructions at home: It is up to you to get the results of your test. Ask your health care provider, or the department that is doing the test, when your results will be ready. Keep all follow-up visits. This is important. Summary An echocardiogram is a test that uses sound waves (ultrasound) to produce images of the heart. Images from an echocardiogram can provide important information about the size and shape of your heart, heart muscle function, heart valve function, and other possible heart problems. You do not need to do anything to prepare before this test. You may eat and drink normally. After the echocardiogram is completed, you may return to your normal, everyday life, unless your health care provider tells you not to do that. This information is not intended to replace advice given to you by your health care provider. Make sure you discuss any questions you have with your healthcare provider. Document Revised: 04/18/2020 Document Reviewed: 04/18/2020 Elsevier Patient Education   2022 Elsevier Inc.    Adopting a Healthy Lifestyle.  Know what a healthy weight is for you (roughly BMI <25) and aim to maintain this   Aim for 7+ servings of fruits and vegetables daily   65-80+ fluid ounces of water or unsweet tea for healthy kidneys   Limit to max 1 drink of alcohol per day; avoid smoking/tobacco   Limit animal fats in diet for cholesterol and heart health - choose grass fed whenever available   Avoid highly processed foods, and foods high in saturated/trans fats   Aim for low stress - take time to unwind and care for your mental health   Aim for 150 min of moderate intensity exercise weekly for heart health, and weights twice weekly for bone health   Aim for 7-9 hours of sleep daily   When it comes to diets, agreement about the perfect plan isnt easy to find, even among the experts. Experts at the [employer] developed an idea known as the Healthy Eating Plate. Just imagine a plate divided into logical, healthy portions.   The emphasis is on diet quality:   Load up on vegetables and fruits - one-half of your plate: Aim for color and variety, and remember that potatoes dont count.   Go for whole grains - one-quarter of your plate: Whole wheat, barley, wheat berries, quinoa, oats, brown rice, and foods made with them. If you want pasta, go with whole wheat pasta.   Protein power - one-quarter of your plate: Fish, chicken, beans, and nuts are all healthy, versatile protein sources. Limit red meat.   The diet, however, does go beyond the plate, offering a few other suggestions.   Use healthy plant oils, such as olive, canola, soy, corn, sunflower and peanut. Check the labels, and avoid partially hydrogenated oil, which have unhealthy trans fats.   If youre  thirsty, drink water. Coffee and tea are good in moderation, but skip sugary drinks and limit milk and dairy products to one or two daily servings.   The type of carbohydrate in the  diet is more important than the amount. Some sources of carbohydrates, such as vegetables, fruits, whole grains, and beans-are healthier than others.   Finally, stay active  Signed, Thomasene Ripple, DO  04/12/2021 8:43 PM    [facility]

## 2021-04-17 DIAGNOSIS — H6993 Unspecified Eustachian tube disorder, bilateral: Secondary | ICD-10-CM | POA: Insufficient documentation

## 2021-04-17 DIAGNOSIS — R002 Palpitations: Secondary | ICD-10-CM

## 2021-04-17 HISTORY — DX: Unspecified eustachian tube disorder, bilateral: H69.93

## 2021-04-18 ENCOUNTER — Ambulatory Visit: Payer: Medicaid Other | Admitting: Internal Medicine

## 2021-04-24 ENCOUNTER — Other Ambulatory Visit: Payer: Medicaid Other

## 2021-05-03 ENCOUNTER — Other Ambulatory Visit: Payer: Medicaid Other

## 2021-05-07 DIAGNOSIS — R053 Chronic cough: Secondary | ICD-10-CM | POA: Insufficient documentation

## 2021-06-06 ENCOUNTER — Ambulatory Visit: Payer: Medicaid Other | Admitting: Cardiology

## 2021-07-24 ENCOUNTER — Ambulatory Visit: Payer: Medicaid Other | Admitting: Cardiology

## 2022-01-10 ENCOUNTER — Ambulatory Visit (INDEPENDENT_AMBULATORY_CARE_PROVIDER_SITE_OTHER): Payer: Self-pay | Admitting: Obstetrics

## 2022-01-10 DIAGNOSIS — N6320 Unspecified lump in the left breast, unspecified quadrant: Secondary | ICD-10-CM

## 2022-01-10 NOTE — Progress Notes (Signed)
Pt unable to come to appt today due to COVID exposure - next available visit in July. Reports she has a knot in her breast. Order for mammogram placed. Will follow-up at [facility] in July. ? ?M. Chryl Heck, CNM ?

## 2022-01-21 ENCOUNTER — Other Ambulatory Visit: Payer: Self-pay | Admitting: Obstetrics

## 2022-01-21 DIAGNOSIS — N6320 Unspecified lump in the left breast, unspecified quadrant: Secondary | ICD-10-CM

## 2022-01-24 ENCOUNTER — Encounter: Payer: Self-pay | Admitting: Obstetrics

## 2022-01-31 DIAGNOSIS — H7091 Unspecified mastoiditis, right ear: Secondary | ICD-10-CM

## 2022-01-31 HISTORY — DX: Unspecified mastoiditis, right ear: H70.91

## 2022-02-07 ENCOUNTER — Encounter: Payer: Self-pay | Admitting: Obstetrics

## 2022-02-13 ENCOUNTER — Ambulatory Visit
Admission: RE | Admit: 2022-02-13 | Discharge: 2022-02-13 | Disposition: A | Discharge status: Home / Self Care | Payer: Medicaid Other | Source: Ambulatory Visit | Attending: Obstetrics | Admitting: Obstetrics

## 2022-02-13 DIAGNOSIS — N6320 Unspecified lump in the left breast, unspecified quadrant: Secondary | ICD-10-CM | POA: Insufficient documentation

## 2022-02-13 DIAGNOSIS — N6324 Unspecified lump in the left breast, lower inner quadrant: Secondary | ICD-10-CM | POA: Insufficient documentation

## 2022-02-25 ENCOUNTER — Encounter: Payer: Medicaid Other | Admitting: Obstetrics

## 2022-02-27 ENCOUNTER — Encounter: Payer: Self-pay | Admitting: *Deleted

## 2022-02-27 NOTE — Progress Notes (Signed)
Referring:  Merideth Abbey, MD No address on file  PCP: Pcp, No  Neurology was asked to evaluate Alexis Baker, a 51 year old female for a chief complaint of headaches.  Our recommendations of care will be communicated by shared medical record.    CC:  headaches  History provided from self, friend Loraine Leriche  HPI:  Medical co-morbidities: HTN,HLD,  DM, peripheral neuropathy, hypothyroidism, lumbar spondylosis  The patient presents for evaluation of headaches which began in 1994 following a wisdom teeth surgery. Headaches have been daily for the past 15 years. They are described as L>R sharp and throbbing pain with associated photophobia, phonophobia, nausea, and vomiting. Headaches are constant, but are exacerbated by stress and weather changes.  Currently she takes [place] which reduces her headache by about 10-20%. She has tried multiple triptans which were ineffective. Tried many preventive medications but either had side effects or they were not effective. She recently was given a 2 week sample of Qulipta which did help.  She also reports pain which shoots down her left arm as well as paresthesias in her hands and decreased grip strength. She reportedly has an MRI C-spine ordered but this has not yet been done.  Headache History: Onset: 1994 Triggers: stress, weather Aura: blurry vision Location: left>right eye, ear, down her neck and shoulder blade Quality/Description: sharp, throbbing, aching Associated Symptoms:  Photophobia: yes  Phonophobia: yes  Nausea: yes Vomiting: yes Worse with activity?: yes Duration of headaches: constant  Headache days per month: 30 Headache free days per month: 0  Current Treatment: Abortive Ubrelvy 100 mg PRN  Preventative none  Prior Therapies                                 Cymbalta Effexor Gabapentin Lyrica Losartan Propranolol 120 mg daily Depakote - hair  loss Topamax Aimovig Zonisamide Amitriptyline Nortriptyline Botox Occipital nerve block - helped Ubrelvy Maxalt Imitrex subq and oral Zomig Relpax   LABS: N/a  IMAGING:  MRI brain 11/27/20: unremarkable   Current Outpatient Medications on File Prior to Visit  Medication Sig Dispense Refill   aspirin 81 MG EC tablet Take by mouth.     atorvastatin (LIPITOR) 80 MG tablet 80 mg daily.     BIOTIN 5000 PO Take by mouth.     carboxymethylcellulose (REFRESH PLUS) 0.5 % SOLN 1 drop 3 times daily.     cyclobenzaprine (FLEXERIL) 10 MG tablet Take 10 mg by mouth at bedtime.     ELDERBERRY PO Take 630 mg by mouth.     FLUoxetine (PROZAC) 10 MG capsule Take 10 mg by mouth daily.     Fluticasone-Umeclidin-Vilant (TRELEGY ELLIPTA) 200-62.5-25 MCG/INH AEPB Inhale 1 puff into the lungs daily.     hydrOXYzine (ATARAX/VISTARIL) 25 MG tablet 25 mg at bedtime.     insulin aspart (NOVOLOG) 100 UNIT/ML injection Inject 26 Units into the skin 3 (three) times daily before meals.     Insulin NPH Isophane & Regular (NOVOLIN 70/30 [place]) Inject into the skin.     insulin NPH-regular Human (70-30) 100 UNIT/ML injection Inject into the skin.     levothyroxine (SYNTHROID) 25 MCG tablet 25 mcg daily.     losartan (COZAAR) 50 MG tablet 50 mg daily.     metFORMIN (GLUCOPHAGE) 500 MG tablet Take 500 mg by mouth 2 (two) times daily with a meal.     mometasone (NASONEX) 50 MCG/ACT nasal spray Place 2  sprays into the nose daily.     montelukast (SINGULAIR) 10 MG tablet Take 10 mg by mouth at bedtime.     nitroGLYCERIN (NITROSTAT) 0.4 MG SL tablet PLACE 1 TABLET UNDER THE TONGUE EVERY 5 MINUTES AS NEEDED FOR CHEST PAIN. 25 tablet 0   omeprazole (PRILOSEC) 40 MG capsule 40 mg as needed.     propranolol (INNOPRAN XL) 120 MG 24 hr capsule Take 1 capsule by mouth daily.     traZODone (DESYREL) 50 MG tablet Take 100 mg by mouth at bedtime.     Zinc 50 MG TABS Take by mouth.     No current facility-administered  medications on file prior to visit.     Allergies: Allergies  Allergen Reactions   Codeine    Sulfa Antibiotics    Penicillins Rash    Family History: Family History  Problem Relation Age of Onset   Diabetes Father    Heart disease Father    Breast cancer Paternal Aunt    Heart attack Maternal Grandmother    Diabetes Maternal Grandmother    Diabetes Paternal Grandmother    Dementia Paternal Grandmother    Breast cancer Cousin        paternal   Diabetes Brother    Congestive Heart Failure Brother      Past Medical History: Past Medical History:  Diagnosis Date   Chest pain of uncertain etiology 01/13/2020   Chronic pain    CVA (cerebral vascular accident) (HCC) 2016   Depression    Diabetes mellitus due to underlying condition with hyperosmolarity without coma, without long-term current use of insulin (HCC) 01/13/2020   Dizziness 01/13/2020   Essential hypertension 03/03/2014   Fibromyalgia    GERD (gastroesophageal reflux disease)    Hereditary and idiopathic peripheral neuropathy 02/26/2012   Insomnia 02/26/2012   Migraine, unspecified, not intractable, without status migrainosus 02/26/2012   Mixed hyperlipidemia 01/13/2020   Neck pain 02/26/2012   Palpitations 01/13/2020   Shortness of breath 01/13/2020   Thyroid disease     Past Surgical History Past Surgical History:  Procedure Laterality Date   INNER EAR SURGERY     Tubes placed in right   TONSILLECTOMY AND ADENOIDECTOMY     TUBAL LIGATION      Social History: Social History   Tobacco Use   Smoking status: Never   Smokeless tobacco: Never  Substance Use Topics   Alcohol use: Never   Drug use: Never    ROS: Negative for fevers, chills. Positive for headaches, neck pain. All other systems reviewed and negative unless stated otherwise in HPI.   Physical Exam:   Vital Signs: BP (!) 142/80   Pulse 68   Ht 5' 2.5" (1.588 m)   Wt 170 lb (77.1 kg)   BMI 30.60 kg/m  GENERAL: well  appearing,in no acute distress,alert SKIN:  Color, texture, turgor normal. No rashes or lesions HEAD:  Normocephalic/atraumatic. CV:  RRR RESP: Normal respiratory effort MSK: +tenderness to palpation over bilateral occiput, neck, and shoulders  NEUROLOGICAL: Mental Status: Alert, oriented to person, place and time,Follows commands Cranial Nerves: PERRL, visual fields intact to confrontation, extraocular movements intact, facial sensation intact, no facial droop or ptosis, hearing grossly intact, no dysarthria Motor: muscle strength 5/5 both upper and lower extremities,no drift, normal tone Reflexes: 3+ throughout Sensation: intact to light touch all 4 extremities Coordination: Finger-to- nose-finger intact bilaterally Gait: normal-based   IMPRESSION: 51 year old female with a history of HTN,HLD,  DM, peripheral neuropathy, hypothyroidism, lumbar  spondylosis who presents for evaluation of migraines. She has failed multiple medications due to side effects or lack of efficacy. Did have some improvement with Qulipta sample. Will start Qulipta daily for headache prevention. Counseled to avoid Ubrelvy while taking this. She has significant neck pain and muscle spasm on exam. Will refer to neck PT and have her return for occipital nerve block as she found that helpful previously. Agree with MRI C-spine to assess for spinal/foraminal stenosis.  PLAN: -Start Qulipta 60 mg daily for headache prevention -Return for occipital nerve block -Referral to neck PT -next steps: consider Vyepti, Emgality, Ajovy   I spent a total of 43 minutes chart reviewing and counseling the patient. Headache education was done. Discussed treatment options including preventive and acute medications, and physical therapy. Discussed medication side effects, adverse reactions and drug interactions. Written educational materials and patient instructions outlining all of the above were given.  Follow-up: 6 months   Ocie Doyne, MD 02/28/2022   4:10 PM

## 2022-02-28 ENCOUNTER — Ambulatory Visit: Payer: Medicaid Other | Admitting: Psychiatry

## 2022-02-28 ENCOUNTER — Encounter: Payer: Self-pay | Admitting: Psychiatry

## 2022-02-28 VITALS — BP 142/80 | HR 68 | Ht 62.5 in | Wt 170.0 lb

## 2022-02-28 DIAGNOSIS — M542 Cervicalgia: Secondary | ICD-10-CM

## 2022-02-28 DIAGNOSIS — G43019 Migraine without aura, intractable, without status migrainosus: Secondary | ICD-10-CM | POA: Diagnosis not present

## 2022-02-28 MED ORDER — QULIPTA 60 MG PO TABS
60.0000 mg | ORAL_TABLET | Freq: Every day | ORAL | 6 refills | Status: DC
Start: 2022-02-28 — End: 2023-04-28

## 2022-02-28 MED ORDER — QULIPTA 60 MG PO TABS: 60.0000 mg | ORAL_TABLET | Freq: Every day | ORAL | 6 refills | Status: DC

## 2022-02-28 NOTE — Patient Instructions (Addendum)
Start Qulipta 60 mg daily for headache prevention Return for occipital nerve block Physical therapy for the neck

## 2022-03-04 ENCOUNTER — Telehealth: Payer: Self-pay

## 2022-03-04 ENCOUNTER — Telehealth: Payer: Self-pay | Admitting: Psychiatry

## 2022-03-04 NOTE — Telephone Encounter (Signed)
PA denied. Will attempt appeal. Pt has been sent a Mychart message requesting she come by the office to sign the form.

## 2022-03-05 NOTE — Telephone Encounter (Signed)
Attempted to call pt to discuss. Voicemail box was full and could not leave a message. Will try and call back another time.

## 2022-03-13 NOTE — Telephone Encounter (Addendum)
Attempted to reach the pt and received message the vm was full and not accepting new messages.Will try again at a later time.

## 2022-03-13 NOTE — Telephone Encounter (Signed)
Nurtec and Bernita Raisin both work the same way so she should avoid taking both of them with the [place]. Has she tried Zomig nasal spray? If not I could send that in and see if it helps

## 2022-03-18 ENCOUNTER — Other Ambulatory Visit: Payer: Self-pay | Admitting: Psychiatry

## 2022-03-18 MED ORDER — LASMIDITAN SUCCINATE 100 MG PO TABS: 100.0000 mg | ORAL_TABLET | ORAL | 5 refills | Status: DC | PRN

## 2022-03-18 MED ORDER — LASMIDITAN SUCCINATE 100 MG PO TABS
100.0000 mg | ORAL_TABLET | ORAL | 5 refills | Status: DC | PRN
Start: 2022-03-18 — End: 2022-03-18

## 2022-03-18 NOTE — Telephone Encounter (Signed)
I called pt and discuessed message. She is amneable to trying med and understands side effects/driving recommendation.  Will work on [place]. Pt request med to be re sent to [facility] if possible.

## 2022-03-18 NOTE — Telephone Encounter (Addendum)
Attempted to reach the pt and received message the vm was full and not accepting new messages. Will send a letter asking for the pt to call back so we can discuss.

## 2022-03-18 NOTE — Addendum Note (Signed)
Addended by: Ann Maki on: 03/18/2022 04:54 PM   Modules accepted: Orders

## 2022-03-18 NOTE — Telephone Encounter (Signed)
I'll send in a prescription for lasmiditan (Reyvow) and see if we can get that approved for rescue. This medication can cause drowsiness so she should avoid driving for 8 hours after taking it

## 2022-03-18 NOTE — Telephone Encounter (Signed)
Pt returned my call. She confirmed several years ago she tired the zomig nasal spray and did not have much benefit.  She would prefer to try a new medication if possible.

## 2022-03-19 ENCOUNTER — Telehealth: Payer: Self-pay

## 2022-03-19 MED ORDER — LASMIDITAN SUCCINATE 100 MG PO TABS: 100.0000 mg | ORAL_TABLET | ORAL | 5 refills | Status: DC | PRN

## 2022-03-19 NOTE — Telephone Encounter (Signed)
PA for reyvow has been approved  (Key: [id number])  This request has received a Favorable outcome.  Please note any additional information provided by [facility] at the bottom of this request.  This drug has been approved. Approved quantity: 8 tablets per 30 day(s). You may fill up to a 34 day supply at a retail pharmacy. You may fill up to a 90 day supply for maintenance drugs, please refer to the formulary for details. Please call the pharmacy to process your prescription claim.

## 2022-03-19 NOTE — Addendum Note (Signed)
Addended by: Ocie Doyne on: 03/19/2022 08:17 AM   Modules accepted: Orders

## 2022-03-19 NOTE — Telephone Encounter (Signed)
I resent the rx, thanks

## 2022-03-19 NOTE — Telephone Encounter (Signed)
PA for Reyvow has been sent via cmm.  (Key: [id number])  Your information has been sent to [facility].

## 2022-03-27 ENCOUNTER — Encounter: Payer: Self-pay | Admitting: Obstetrics

## 2022-03-28 ENCOUNTER — Ambulatory Visit (INDEPENDENT_AMBULATORY_CARE_PROVIDER_SITE_OTHER): Payer: Medicaid Other | Admitting: Psychiatry

## 2022-03-28 VITALS — BP 133/84 | HR 98

## 2022-03-28 DIAGNOSIS — M5481 Occipital neuralgia: Secondary | ICD-10-CM | POA: Diagnosis not present

## 2022-03-28 DIAGNOSIS — M542 Cervicalgia: Secondary | ICD-10-CM

## 2022-03-28 MED ORDER — BUPIVACAINE HCL (PF) 0.25 % IJ SOLN: 8.0000 mL | Freq: Once | INTRAMUSCULAR | Status: AC

## 2022-03-28 MED ORDER — BETAMETHASONE SOD PHOS & ACET 6 (3-3) MG/ML IJ SUSP: 12.0000 mg | Freq: Once | INTRAMUSCULAR | Status: AC

## 2022-03-28 NOTE — Patient Instructions (Addendum)
Please have MRI C-spine results sent to our office  Financial assistance programs:  [email or web address]  [email or web address]

## 2022-03-28 NOTE — Addendum Note (Signed)
Addended by: Ocie Doyne on: 03/28/2022 03:40 PM   Modules accepted: Orders

## 2022-03-28 NOTE — Progress Notes (Signed)
Procedure: Occipital Nerve injection/Trigger point injection  Location: bilateral occiput  The risks, benefits and anticipated outcomes of the procedure, the risks and benefits of the alternatives to the procedure, and the roles and tasks of the personnel to be involved, were discussed with the patient, and the patient consents to the procedure and agrees to proceed.     A combination of 1 cc betamethasone 6 mg and 4 cc of 0.25% bupivacaine were prepared in 2 syringes (5 cc).  2 trigger points on the splenius capitus were identified and injected. The bilateral greater occipital nerves were injected 3cm caudal and 1.5 cm lateral to the inion where the main trunk of the occipital nerve penetrates the semispinalis muscle.  The needle was placed perpendicular and the needle advanced 1.5 cm. After aspiration to ensure no obstruction or presence of blood, the area was injected.  The needle was repositioned in a fan-like manner and the entire area was injected. Pressure was held and no hematoma was noted. Pain relief was noted after 5 minutes.  Ocie Doyne, MD 03/28/22 3:14 PM

## 2022-04-26 ENCOUNTER — Encounter: Payer: Self-pay | Admitting: Obstetrics

## 2022-04-29 NOTE — Telephone Encounter (Signed)
Patient was a no show for her appointment on 04/26/22.

## 2022-06-20 ENCOUNTER — Ambulatory Visit: Payer: Medicaid Other | Admitting: Psychiatry

## 2022-09-17 ENCOUNTER — Ambulatory Visit: Payer: Self-pay | Admitting: Psychiatry

## 2022-09-25 ENCOUNTER — Other Ambulatory Visit: Payer: Self-pay | Admitting: Psychiatry

## 2022-10-23 DIAGNOSIS — K219 Gastro-esophageal reflux disease without esophagitis: Secondary | ICD-10-CM | POA: Insufficient documentation

## 2022-10-23 DIAGNOSIS — F32A Depression, unspecified: Secondary | ICD-10-CM | POA: Insufficient documentation

## 2022-10-23 DIAGNOSIS — M797 Fibromyalgia: Secondary | ICD-10-CM | POA: Insufficient documentation

## 2022-10-23 DIAGNOSIS — E079 Disorder of thyroid, unspecified: Secondary | ICD-10-CM | POA: Insufficient documentation

## 2022-10-24 ENCOUNTER — Encounter: Payer: Self-pay | Admitting: Cardiology

## 2022-10-24 ENCOUNTER — Ambulatory Visit: Payer: BC Managed Care – PPO | Attending: Cardiology | Admitting: Cardiology

## 2022-10-24 VITALS — BP 110/64 | HR 101 | Ht 62.0 in | Wt 173.4 lb

## 2022-10-24 DIAGNOSIS — R0602 Shortness of breath: Secondary | ICD-10-CM

## 2022-10-24 DIAGNOSIS — E782 Mixed hyperlipidemia: Secondary | ICD-10-CM

## 2022-10-24 DIAGNOSIS — R072 Precordial pain: Secondary | ICD-10-CM

## 2022-10-24 DIAGNOSIS — Z794 Long term (current) use of insulin: Secondary | ICD-10-CM

## 2022-10-24 DIAGNOSIS — E119 Type 2 diabetes mellitus without complications: Secondary | ICD-10-CM | POA: Diagnosis not present

## 2022-10-24 DIAGNOSIS — I10 Essential (primary) hypertension: Secondary | ICD-10-CM

## 2022-10-24 MED ORDER — METOPROLOL TARTRATE 100 MG PO TABS: 100.0000 mg | ORAL_TABLET | Freq: Once | ORAL | 0 refills | Status: DC

## 2022-10-24 NOTE — Progress Notes (Signed)
Cardiology Office Note:    Date:  10/24/2022   ID:  Alexis Baker, DOB 11-27-1970, MRN [id number]  PCP:  [facility]  Cardiologist:  Shirlee More, MD    Referring MD: [facility]*    ASSESSMENT:    1. Precordial pain   2. SOB (shortness of breath)   3. Essential hypertension   4. Mixed hyperlipidemia   5. Insulin-requiring or dependent type II diabetes mellitus ([place])    PLAN:    In order of problems listed above:  Is in a high risk group with her type 2 diabetes requiring insulin hypertension hyperlipidemia ongoing chest pain and I think she needs undergo I cardiac CTA for ischemia evaluation.  If high risk markers will consider the merits of revascularization.  Potentially shortness of breath could be anginal equivalent. Stable BP is controlled she is on an ARB and takes propranolol for panic disorder Continue her statin we will check a lipid profile Managed by her PCP   Next appointment: 3 months   Medication Adjustments/Labs and Tests Ordered: Current medicines are reviewed at length with the patient today.  Concerns regarding medicines are outlined above.  No orders of the defined types were placed in this encounter.  No orders of the defined types were placed in this encounter.   I was due for follow-up with cardiology   History of Present Illness:    Alexis Baker is a 52 y.o. female with a hx of hypertension hyperlipidemia type 2 diabetes , previous  coronary calcium score was 0 last seen August 2022 by my partner Dr. Jorene Minors with complaint of shortness of breath..  She had testing ordered including echocardiogram and a monitor.  Compliance with diet, lifestyle and medications: Yes  In the interim she has had a hospitalization for COVID-pneumonia,  She continues to have episodes she calls panic associated with rapid heart rate and shortness of breath but also has chest tightness with and without activity relieved  with rest. She did not have a CT angiogram she had slowly a coronary calcium score performed in 2022 With ongoing chest pain I think she needs further evaluation I advised her to undergo cardiac CTA she has no dye allergy  Her resting heart rate tends to be rapid despite propranolol she takes for panic disorder no edema orthopnea or syncope  Her event monitor reported 04/29/2021 was normal.  Echocardiogram performed May 2021 showed normal left ventricular size wall thickness ejection fraction but diastolic function was defined is supranormal with elevated filling pressure  Past Surgical History:  Procedure Laterality Date   INNER EAR SURGERY     Tubes placed in right   TONSILLECTOMY AND ADENOIDECTOMY     TUBAL LIGATION      Current Medications: Current Meds  Medication Sig   Atogepant (QULIPTA) 60 MG TABS Take 60 mg by mouth daily.   atorvastatin (LIPITOR) 80 MG tablet 80 mg daily.   cyclobenzaprine (FLEXERIL) 10 MG tablet Take 10 mg by mouth at bedtime.   empagliflozin (JARDIANCE) 10 MG TABS tablet Take 10 mg by mouth daily.   Fluticasone-Umeclidin-Vilant (TRELEGY ELLIPTA) 200-62.5-25 MCG/INH AEPB Inhale 1 puff into the lungs daily.   hydrOXYzine (ATARAX/VISTARIL) 25 MG tablet Take 25 mg by mouth at bedtime.   Insulin NPH Isophane & Regular (NOVOLIN 70/30 [place]) Inject into the skin.   Lasmiditan Succinate (REYVOW) 100 MG TABS TAKE 1 TABLET BY MOUTH AS NEEDED FOR MIGRAINE, MAX 1 TABLET PER 24 HOURS  levothyroxine (SYNTHROID) 25 MCG tablet Take 25 mcg by mouth daily.   losartan (COZAAR) 50 MG tablet Take 50 mg by mouth at bedtime.   mometasone (NASONEX) 50 MCG/ACT nasal spray Place 2 sprays into the nose daily as needed (allergies).   montelukast (SINGULAIR) 10 MG tablet Take 10 mg by mouth at bedtime.   nitroGLYCERIN (NITROSTAT) 0.4 MG SL tablet PLACE 1 TABLET UNDER THE TONGUE EVERY 5 MINUTES AS NEEDED FOR CHEST PAIN.   omeprazole (PRILOSEC) 40 MG capsule Take 40 mg by mouth daily.    traZODone (DESYREL) 50 MG tablet Take 100 mg by mouth at bedtime.   Current Facility-Administered Medications for the 10/24/22 encounter (Office Visit) with Richardo Priest, MD  Medication   betamethasone acetate-betamethasone sodium phosphate (CELESTONE) injection 12 mg   bupivacaine (PF) (MARCAINE) 0.25 % injection 8 mL     Allergies:   Codeine, Sulfa antibiotics, and Penicillins   Social History   Socioeconomic History   Marital status: Unknown    Spouse name: Not on file   Number of children: Not on file   Years of education: Not on file   Highest education level: Not on file  Occupational History   Not on file  Tobacco Use   Smoking status: Never   Smokeless tobacco: Never  Substance and Sexual Activity   Alcohol use: Never   Drug use: Never   Sexual activity: Not on file  Other Topics Concern   Not on file  Social History Narrative   Not on file   Social Determinants of Health   Financial Resource Strain: Not on file  Food Insecurity: Not on file  Transportation Needs: Not on file  Physical Activity: Not on file  Stress: Not on file  Social Connections: Not on file     Family History: The patient's family history includes Breast cancer in her cousin and paternal aunt; Congestive Heart Failure in her brother; Dementia in her paternal grandmother; Diabetes in her brother, father, maternal grandmother, and paternal grandmother; Heart attack in her maternal grandmother; Heart disease in her father. ROS:   Please see the history of present illness.    All other systems reviewed and are negative.  EKGs/Labs/Other Studies Reviewed:    The following studies were reviewed today:  EKG:  EKG ordered today and personally reviewed.  The ekg ordered today demonstrates sinus tachycardia 101 bpm otherwise normal EKG with poor R wave progression  No recent labs available for review  Physical Exam:    VS:  BP 110/64 (BP Location: Right Arm, Patient Position: Sitting)    Pulse (!) 101   Ht 5' 2"$  (1.575 m)   Wt 173 lb 6.4 oz (78.7 kg)   SpO2 99%   BMI 31.72 kg/m     Wt Readings from Last 3 Encounters:  10/24/22 173 lb 6.4 oz (78.7 kg)  02/28/22 170 lb (77.1 kg)  04/11/21 156 lb 1.3 oz (70.8 kg)     GEN:  Well nourished, well developed in no acute distress HEENT: Normal NECK: No JVD; No carotid bruits LYMPHATICS: No lymphadenopathy CARDIAC: RRR, no murmurs, rubs, gallops RESPIRATORY:  Clear to auscultation without rales, wheezing or rhonchi  ABDOMEN: Soft, non-tender, non-distended MUSCULOSKELETAL:  No edema; No deformity  SKIN: Warm and dry NEUROLOGIC:  Alert and oriented x 3 PSYCHIATRIC:  Normal affect    Signed, Shirlee More, MD  10/24/2022 5:02 PM    [facility]

## 2022-10-24 NOTE — Patient Instructions (Signed)
Medication Instructions:  Your physician recommends that you continue on your current medications as directed. Please refer to the Current Medication list given to you today.  *If you need a refill on your cardiac medications before your next appointment, please call your pharmacy*   Lab Work: Your physician recommends that you return for lab work in:   Labs today: BMP, Lipids   If you have labs (blood work) drawn today and your tests are completely normal, you will receive your results only by: [place] (if you have [place]) OR A paper copy in the mail If you have any lab test that is abnormal or we need to change your treatment, we will call you to review the results.   Testing/Procedures:   Your cardiac CT will be scheduled at one of the below locations:   [facility] [address] [place], [place] [address] (336) [place] [address] [place], [place] [address] [phone]  [facility] [place], [place] [address] [phone]  If scheduled at [facility], please arrive at the [facility] and Children's Entrance (Entrance C2) of [facility] 30 minutes prior to test start time. You can use the FREE valet parking offered at entrance C (encouraged to control the heart rate for the test)  Proceed to the [facility] Radiology Department (first floor) to check-in and test prep.  All radiology patients and guests should use entrance C2 at [facility], accessed from [facility], even though the hospital's physical address listed is [address]    If scheduled at [facility] or [facility], please arrive 15 mins early for check-in and test prep.   Please follow these instructions carefully (unless otherwise directed):   On the Night Before the Test: Be  sure to Drink plenty of water. Do not consume any caffeinated/decaffeinated beverages or chocolate 12 hours prior to your test. Do not take any antihistamines 12 hours prior to your test.  On the Day of the Test: Drink plenty of water until 1 hour prior to the test. Do not eat any food 1 hour prior to test. You may take your regular medications prior to the test.  Take metoprolol (Lopressor) two hours prior to test. FEMALES- please wear underwire-free bra if available, avoid dresses & tight clothing      After the Test: Drink plenty of water. After receiving IV contrast, you may experience a mild flushed feeling. This is normal. On occasion, you may experience a mild rash up to 24 hours after the test. This is not dangerous. If this occurs, you can take Benadryl 25 mg and increase your fluid intake. If you experience trouble breathing, this can be serious. If it is severe call 911 IMMEDIATELY. If it is mild, please call our office. If you take any of these medications: Glipizide/Metformin, Avandament, Glucavance, please do not take 48 hours after completing test unless otherwise instructed.  We will call to schedule your test 2-4 weeks out understanding that some insurance companies will need an authorization prior to the service being performed.   For non-scheduling related questions, please contact the cardiac imaging nurse navigator should you have any questions/concerns: Marchia Bond, Cardiac Imaging Nurse Navigator Gordy Clement, Cardiac Imaging Nurse Navigator [facility] Direct Office Dial: [phone]   For scheduling needs, including cancellations and rescheduling, please  call [place], [phone].    Follow-Up: At [facility], you and your health needs are our priority.  As part of our continuing mission to provide you with exceptional heart care, we have created designated Provider Care Teams.  These Care Teams include your primary  Cardiologist (physician) and Advanced Practice Providers (APPs -  Physician Assistants and Nurse Practitioners) who all work together to provide you with the care you need, when you need it.  We recommend signing up for the patient portal called "MyChart".  Sign up information is provided on this After Visit Summary.  MyChart is used to connect with patients for Virtual Visits (Telemedicine).  Patients are able to view lab/test results, encounter notes, upcoming appointments, etc.  Non-urgent messages can be sent to your provider as well.   To learn more about what you can do with MyChart, go to [email or web address].    Your next appointment:   3 month(s)  Provider:   Shirlee More, MD    Other Instructions None

## 2022-10-25 LAB — BASIC METABOLIC PANEL
BUN/Creatinine Ratio: 13 (ref 9–23)
BUN: 12 mg/dL (ref 6–24)
CO2: 23 mmol/L (ref 20–29)
Calcium: 9.7 mg/dL (ref 8.7–10.2)
Chloride: 99 mmol/L (ref 96–106)
Creatinine, Ser: 0.94 mg/dL (ref 0.57–1.00)
Glucose: 208 mg/dL — ABNORMAL HIGH (ref 70–99)
Potassium: 5.1 mmol/L (ref 3.5–5.2)
Sodium: 137 mmol/L (ref 134–144)
eGFR: 73 mL/min/{1.73_m2} (ref >59)

## 2022-10-25 LAB — LIPID PANEL
Chol/HDL Ratio: 2.7 ratio (ref 0.0–4.4)
Cholesterol, Total: 146 mg/dL (ref 100–199)
HDL: 54 mg/dL (ref >39)
LDL Chol Calc (NIH): 65 mg/dL (ref 0–99)
Triglycerides: 162 mg/dL — ABNORMAL HIGH (ref 0–149)
VLDL Cholesterol Cal: 27 mg/dL (ref 5–40)

## 2022-10-29 ENCOUNTER — Telehealth: Payer: Self-pay | Admitting: Cardiology

## 2022-10-29 ENCOUNTER — Other Ambulatory Visit: Payer: Self-pay

## 2022-10-29 NOTE — Telephone Encounter (Signed)
Patient informed of results.  

## 2022-10-29 NOTE — Telephone Encounter (Signed)
Pt is returning call in regards to lab results. Requesting return call.

## 2022-11-05 DIAGNOSIS — E782 Mixed hyperlipidemia: Secondary | ICD-10-CM | POA: Diagnosis not present

## 2022-11-05 DIAGNOSIS — G43011 Migraine without aura, intractable, with status migrainosus: Secondary | ICD-10-CM | POA: Diagnosis not present

## 2022-11-05 DIAGNOSIS — E039 Hypothyroidism, unspecified: Secondary | ICD-10-CM | POA: Diagnosis not present

## 2022-11-05 DIAGNOSIS — I10 Essential (primary) hypertension: Secondary | ICD-10-CM | POA: Diagnosis not present

## 2022-11-05 DIAGNOSIS — F419 Anxiety disorder, unspecified: Secondary | ICD-10-CM | POA: Diagnosis not present

## 2022-11-05 DIAGNOSIS — E118 Type 2 diabetes mellitus with unspecified complications: Secondary | ICD-10-CM | POA: Diagnosis not present

## 2022-11-05 DIAGNOSIS — J0100 Acute maxillary sinusitis, unspecified: Secondary | ICD-10-CM | POA: Diagnosis not present

## 2022-11-11 ENCOUNTER — Telehealth (HOSPITAL_COMMUNITY): Payer: Self-pay | Admitting: Emergency Medicine

## 2022-11-11 NOTE — Telephone Encounter (Signed)
Attempted to call patient regarding upcoming cardiac CT appointment. °Left message on voicemail with name and callback number °London Tarnowski RN Navigator Cardiac Imaging °[facility] °[phone] Office °[phone] Cell ° °

## 2022-11-11 NOTE — Telephone Encounter (Signed)
Reaching out to patient to offer assistance regarding upcoming cardiac imaging study; pt verbalizes understanding of appt date/time, parking situation and where to check in, pre-test NPO status and medications ordered, and verified current allergies; name and call back number provided for further questions should they arise Marchia Bond RN Navigator Cardiac Imaging [facility] [phone] office [phone] cell  Arrival 230 WC entrance '100mg'$  metoprolol tartrate  Daily meds Denies iv issues Aware contrast/nitro

## 2022-11-12 ENCOUNTER — Ambulatory Visit (HOSPITAL_COMMUNITY)
Admission: RE | Admit: 2022-11-12 | Discharge: 2022-11-12 | Disposition: A | Discharge status: Home / Self Care | Payer: BC Managed Care – PPO | Source: Ambulatory Visit | Attending: Cardiology | Admitting: Cardiology

## 2022-11-12 DIAGNOSIS — R072 Precordial pain: Secondary | ICD-10-CM | POA: Diagnosis present

## 2022-11-12 MED ORDER — NITROGLYCERIN 0.4 MG SL SUBL: 0.8000 mg | SUBLINGUAL_TABLET | Freq: Once | SUBLINGUAL | Status: AC

## 2022-11-12 MED ORDER — NITROGLYCERIN 0.4 MG SL SUBL
0.8000 mg | SUBLINGUAL_TABLET | Freq: Once | SUBLINGUAL | Status: AC
  Administered 2022-11-12: 0.8 mg via SUBLINGUAL

## 2022-11-12 MED ORDER — NITROGLYCERIN 0.4 MG SL SUBL
SUBLINGUAL_TABLET | SUBLINGUAL | Status: AC
  Filled 2022-11-12: qty 2

## 2022-11-12 MED ORDER — IOHEXOL 350 MG/ML SOLN
100.0000 mL | Freq: Once | INTRAVENOUS | Status: AC | PRN
  Administered 2022-11-12: 100 mL via INTRAVENOUS

## 2022-11-12 MED ORDER — NITROGLYCERIN 0.4 MG SL SUBL
SUBLINGUAL_TABLET | SUBLINGUAL | Status: AC
  Administered 2022-11-12: 0.8 mg via SUBLINGUAL
  Filled 2022-11-12: qty 2

## 2022-11-12 NOTE — Progress Notes (Signed)
Patient asymptomatic. Color appropriate, no dizziness, no nausea, not diaphoretic. Patient states, "I feel fine."

## 2022-11-12 NOTE — Progress Notes (Signed)
Arrived to IR to placed PIV per consult request.  Informed that patient has left the nurses station area and PIV was able to be obtained.

## 2022-11-18 ENCOUNTER — Other Ambulatory Visit: Payer: Self-pay

## 2022-11-18 ENCOUNTER — Telehealth: Payer: Self-pay | Admitting: Cardiology

## 2022-11-18 NOTE — Telephone Encounter (Signed)
    Pt is returning call to get CT result 

## 2022-11-18 NOTE — Telephone Encounter (Signed)
Patient informed of results.  

## 2023-01-28 NOTE — Progress Notes (Deleted)
Cardiology Office Note:    Date:  01/28/2023   ID:  Alexis Baker, DOB November 06, 1970, MRN [id number]  PCP:  [facility]  Cardiologist:  Norman Herrlich, MD    Referring MD: [facility]    ASSESSMENT:    No diagnosis found. PLAN:    In order of problems listed above:  ***   Next appointment: ***   Medication Adjustments/Labs and Tests Ordered: Current medicines are reviewed at length with the patient today.  Concerns regarding medicines are outlined above.  No orders of the defined types were placed in this encounter.  No orders of the defined types were placed in this encounter.   No chief complaint on file.   History of Present Illness:    Alexis Baker is a 52 y.o. female with a hx of hypertension hyperlipidemia type 2 diabetes mellitus previous coronary calcium score of 0 on August 2002 last seen 10/24/2022 with exertional chest pain and shortness of breath.  She had repeat cardiac CTA reported 11/12/2022 with a coronary calcium score of 0 normal coronary arteries and no atherosclerosis aortic valve was not calcified the aorta was normal and over read of the chest CT including lungs was normal. Compliance with diet, lifestyle and medications: *** Past Medical History:  Diagnosis Date   Chest pain of uncertain etiology 01/13/2020   Chronic pain    CVA (cerebral vascular accident) (HCC) 2016   Depression    Diabetes mellitus due to underlying condition with hyperosmolarity without coma, without long-term current use of insulin (HCC) 01/13/2020   Dizziness 01/13/2020   Essential hypertension 03/03/2014   Fibromyalgia    GERD (gastroesophageal reflux disease)    Hereditary and idiopathic peripheral neuropathy 02/26/2012   Insomnia 02/26/2012   Migraine, unspecified, not intractable, without status migrainosus 02/26/2012   Mixed hyperlipidemia 01/13/2020   Neck pain 02/26/2012   Palpitations 01/13/2020   Shortness of  breath 01/13/2020   Thyroid disease     Past Surgical History:  Procedure Laterality Date   INNER EAR SURGERY     Tubes placed in right   TONSILLECTOMY AND ADENOIDECTOMY     TUBAL LIGATION      Current Medications: No outpatient medications have been marked as taking for the 01/29/23 encounter (Appointment) with Baldo Daub, MD.   Current Facility-Administered Medications for the 01/29/23 encounter (Appointment) with Baldo Daub, MD  Medication   betamethasone acetate-betamethasone sodium phosphate (CELESTONE) injection 12 mg   bupivacaine (PF) (MARCAINE) 0.25 % injection 8 mL     Allergies:   Codeine, Sulfa antibiotics, and Penicillins   Social History   Socioeconomic History   Marital status: Unknown    Spouse name: Not on file   Number of children: Not on file   Years of education: Not on file   Highest education level: Not on file  Occupational History   Not on file  Tobacco Use   Smoking status: Never   Smokeless tobacco: Never  Substance and Sexual Activity   Alcohol use: Never   Drug use: Never   Sexual activity: Not on file  Other Topics Concern   Not on file  Social History Narrative   Not on file   Social Determinants of Health   Financial Resource Strain: Not on file  Food Insecurity: Not on file  Transportation Needs: Not on file  Physical Activity: Not on file  Stress: Not on file  Social Connections: Not on file  Family History: The patient's ***family history includes Breast cancer in her cousin and paternal aunt; Congestive Heart Failure in her brother; Dementia in her paternal grandmother; Diabetes in her brother, father, maternal grandmother, and paternal grandmother; Heart attack in her maternal grandmother; Heart disease in her father. ROS:   Please see the history of present illness.    All other systems reviewed and are negative.  EKGs/Labs/Other Studies Reviewed:    The following studies were reviewed today:  Cardiac  Studies & Procedures         MONITORS  LONG TERM MONITOR (3-14 DAYS) 04/29/2021  Narrative Patch Wear Time:  3 days and 5 hours starting 04/17/2021 Indication: Palpitations  Patient had a min HR of 47 bpm, max HR of 103 bpm, and avg HR of 62 bpm. Predominant underlying rhythm was Sinus Rhythm. Isolated SVEs were rare (<1.0%), and no SVE Couplets or SVE Triplets were present. Isolated VEs were rare (<1.0%), and no VE Couplets or VE Triplets were present.  Symptoms were associated with sinus rhythm.  Conclusion: normal unremarkable study.   CT SCANS  CT CORONARY MORPH W/CTA COR W/SCORE 11/14/2022  Addendum 11/14/2022 12:44 AM ADDENDUM REPORT: 11/14/2022 00:41  EXAM: OVER-READ INTERPRETATION  CT CHEST  The following report is an over-read performed by radiologist Dr. Alcide Clever of [facility] on 11/14/2022. This over-read does not include interpretation of cardiac or coronary anatomy or pathology. The coronary calcium score/coronary CTA interpretation by the cardiologist is attached.  COMPARISON:  None.  FINDINGS: Cardiovascular: There are no significant extracardiac vascular findings. No pulmonary emboli identified.  Mediastinum/Nodes: There are no enlarged lymph nodes within the visualized mediastinum.The esophagus as visualized is within normal limits.  Lungs/Pleura: There is no pleural effusion. The visualized lungs appear clear.  Upper abdomen: No significant findings in the visualized upper abdomen.  Musculoskeletal/Chest wall: No chest wall mass or suspicious osseous findings within the visualized chest.  IMPRESSION: No significant extracardiac findings within the visualized chest.   Electronically Signed By: Alcide Clever M.D. On: 11/14/2022 00:41  Narrative CLINICAL DATA:  52 year old with chest pain, DM.  EXAM: Cardiac/Coronary  CTA  TECHNIQUE: The patient was scanned on a [employer].  FINDINGS: A 120 kV prospective scan  was triggered in the descending thoracic aorta at 111 HU's. Axial non-contrast 3 mm slices were carried out through the heart. The data set was analyzed on a dedicated work station and scored using the Agatson method. Gantry rotation speed was 250 msecs and collimation was .6 mm. 0.8 mg of sl NTG was given. The 3D data set was reconstructed in 5% intervals of the 67-82 % of the R-R cycle. Diastolic phases were analyzed on a dedicated work station using MPR, MIP and VRT modes. The patient received 80 cc of contrast.  Image quality: good  Aorta: Normal size (30 mm ascending). No calcifications. No dissection.  Aortic Valve: No calcifications.  Coronary Arteries:  Normal coronary origin.  Right dominance.  RCA is a large dominant artery that gives rise to PDA (moderate sized) and PLA. There is no plaque.  Left main is a large artery that gives rise to LAD and LCX arteries.  LAD is a large vessel that has no plaque.  D1-small caliber, no plaque  D2-moderate caliber, no plaque  D3-small caliber, no plaque  LCX is a non-dominant artery.   There is no plaque.  OM1-small caliber, no plaque  OM2-moderate caliber, no plaque  OM3-small caliber, no plaque  Other  findings:  Normal pulmonary vein drainage into the left atrium.  Normal left atrial appendage without a thrombus.  Normal size of the pulmonary artery.  Please see radiology report for non cardiac findings.  IMPRESSION: 1. Coronary calcium score of 0.  2. Normal coronary origin with right dominance.  3. No evidence of CAD.  CAD-RADS 0. No evidence of CAD (0%). Consider non-atherosclerotic causes of chest pain.  Electronically Signed: By: Donato Schultz M.D. On: 11/12/2022 16:07          EKG:  EKG ordered today and personally reviewed.  The ekg ordered today demonstrates ***  Recent Labs: 10/24/2022: BUN 12; Creatinine, Ser 0.94; Potassium 5.1; Sodium 137  Recent Lipid Panel    Component Value Date/Time    CHOL 146 10/24/2022 1714   TRIG 162 (H) 10/24/2022 1714   HDL 54 10/24/2022 1714   CHOLHDL 2.7 10/24/2022 1714   LDLCALC 65 10/24/2022 1714    Physical Exam:    VS:  There were no vitals taken for this visit.    Wt Readings from Last 3 Encounters:  10/24/22 173 lb 6.4 oz (78.7 kg)  02/28/22 170 lb (77.1 kg)  04/11/21 156 lb 1.3 oz (70.8 kg)     GEN: *** Well nourished, well developed in no acute distress HEENT: Normal NECK: No JVD; No carotid bruits LYMPHATICS: No lymphadenopathy CARDIAC: ***RRR, no murmurs, rubs, gallops RESPIRATORY:  Clear to auscultation without rales, wheezing or rhonchi  ABDOMEN: Soft, non-tender, non-distended MUSCULOSKELETAL:  No edema; No deformity  SKIN: Warm and dry NEUROLOGIC:  Alert and oriented x 3 PSYCHIATRIC:  Normal affect    Signed, Norman Herrlich, MD  01/28/2023 7:01 PM    [facility]

## 2023-01-29 ENCOUNTER — Ambulatory Visit: Payer: BC Managed Care – PPO | Admitting: Cardiology

## 2023-04-08 ENCOUNTER — Encounter: Payer: Self-pay | Admitting: *Deleted

## 2023-04-08 DIAGNOSIS — H906 Mixed conductive and sensorineural hearing loss, bilateral: Secondary | ICD-10-CM

## 2023-04-08 HISTORY — DX: Mixed conductive and sensorineural hearing loss, bilateral: H90.6

## 2023-04-08 NOTE — Progress Notes (Deleted)
Cardiology Office Note:    Date:  04/08/2023   ID:  Alexis Baker, DOB [birth year], MRN [id number]  PCP:  [facility]  Cardiologist:  Norman Herrlich, MD    Referring MD: [facility]    ASSESSMENT:    No diagnosis found. PLAN:    In order of problems listed above:  ***   Next appointment: ***   Medication Adjustments/Labs and Tests Ordered: Current medicines are reviewed at length with the patient today.  Concerns regarding medicines are outlined above.  No orders of the defined types were placed in this encounter.  No orders of the defined types were placed in this encounter.    History of Present Illness:    Alexis Baker is a 52 y.o. female with a hx of hypertension hyperlipidemia type 2 diabetes mellitus and previous coronary calcium score of 0 August 2022 last seen 10/24/2022 for chest pain.  Following the visit she underwent a cardiac CTA showing continued coronary calcium score of 0 and normal coronary arteriography. Compliance with diet, lifestyle and medications: *** Past Medical History:  Diagnosis Date   Chest pain of uncertain etiology 01/13/2020   Chronic pain    CVA (cerebral vascular accident) (HCC) 2016   Depression    Diabetes mellitus due to underlying condition with hyperosmolarity without coma, without long-term current use of insulin (HCC) 01/13/2020   Dizziness 01/13/2020   Essential hypertension 03/03/2014   Fibromyalgia    GERD (gastroesophageal reflux disease)    Hereditary and idiopathic peripheral neuropathy 02/26/2012   Insomnia 02/26/2012   Migraine, unspecified, not intractable, without status migrainosus 02/26/2012   Mixed hyperlipidemia 01/13/2020   Neck pain 02/26/2012   Palpitations 01/13/2020   Shortness of breath 01/13/2020   Thyroid disease     Current Medications: No outpatient medications have been marked as taking for the 04/09/23 encounter (Appointment) with Baldo Daub, MD.   Current Facility-Administered Medications for the 04/09/23 encounter (Appointment) with Baldo Daub, MD  Medication   betamethasone acetate-betamethasone sodium phosphate (CELESTONE) injection 12 mg   bupivacaine (PF) (MARCAINE) 0.25 % injection 8 mL      EKGs/Labs/Other Studies Reviewed:    The following studies were reviewed today:  Cardiac Studies & Procedures         MONITORS  LONG TERM MONITOR (3-14 DAYS) 04/26/2021  Narrative Patch Wear Time:  3 days and 5 hours starting 04/17/2021 Indication: Palpitations  Patient had a min HR of 47 bpm, max HR of 103 bpm, and avg HR of 62 bpm. Predominant underlying rhythm was Sinus Rhythm. Isolated SVEs were rare (<1.0%), and no SVE Couplets or SVE Triplets were present. Isolated VEs were rare (<1.0%), and no VE Couplets or VE Triplets were present.  Symptoms were associated with sinus rhythm.  Conclusion: normal unremarkable study.   CT SCANS  CT CORONARY MORPH W/CTA COR W/SCORE 11/12/2022  Addendum 11/14/2022 12:44 AM ADDENDUM REPORT: 11/14/2022 00:41  EXAM: OVER-READ INTERPRETATION  CT CHEST  The following report is an over-read performed by radiologist Dr. Alcide Clever of [facility] on 11/14/2022. This over-read does not include interpretation of cardiac or coronary anatomy or pathology. The coronary calcium score/coronary CTA interpretation by the cardiologist is attached.  COMPARISON:  None.  FINDINGS: Cardiovascular: There are no significant extracardiac vascular findings. No pulmonary emboli identified.  Mediastinum/Nodes: There are no enlarged lymph nodes within the visualized mediastinum.The esophagus as visualized is within normal limits.  Lungs/Pleura: There is no  pleural effusion. The visualized lungs appear clear.  Upper abdomen: No significant findings in the visualized upper abdomen.  Musculoskeletal/Chest wall: No chest wall mass or suspicious osseous findings within the  visualized chest.  IMPRESSION: No significant extracardiac findings within the visualized chest.   Electronically Signed By: Alcide Clever M.D. On: 11/14/2022 00:41  Narrative CLINICAL DATA:  52 year old with chest pain, DM.  EXAM: Cardiac/Coronary  CTA  TECHNIQUE: The patient was scanned on a [employer].  FINDINGS: A 120 kV prospective scan was triggered in the descending thoracic aorta at 111 HU's. Axial non-contrast 3 mm slices were carried out through the heart. The data set was analyzed on a dedicated work station and scored using the Agatson method. Gantry rotation speed was 250 msecs and collimation was .6 mm. 0.8 mg of sl NTG was given. The 3D data set was reconstructed in 5% intervals of the 67-82 % of the R-R cycle. Diastolic phases were analyzed on a dedicated work station using MPR, MIP and VRT modes. The patient received 80 cc of contrast.  Image quality: good  Aorta: Normal size (30 mm ascending). No calcifications. No dissection.  Aortic Valve: No calcifications.  Coronary Arteries:  Normal coronary origin.  Right dominance.  RCA is a large dominant artery that gives rise to PDA (moderate sized) and PLA. There is no plaque.  Left main is a large artery that gives rise to LAD and LCX arteries.  LAD is a large vessel that has no plaque.  D1-small caliber, no plaque  D2-moderate caliber, no plaque  D3-small caliber, no plaque  LCX is a non-dominant artery.   There is no plaque.  OM1-small caliber, no plaque  OM2-moderate caliber, no plaque  OM3-small caliber, no plaque  Other findings:  Normal pulmonary vein drainage into the left atrium.  Normal left atrial appendage without a thrombus.  Normal size of the pulmonary artery.  Please see radiology report for non cardiac findings.  IMPRESSION: 1. Coronary calcium score of 0.  2. Normal coronary origin with right dominance.  3. No evidence of CAD.  CAD-RADS 0. No  evidence of CAD (0%). Consider non-atherosclerotic causes of chest pain.  Electronically Signed: By: Donato Schultz M.D. On: 11/12/2022 16:07              Recent Labs: 10/24/2022: BUN 12; Creatinine, Ser 0.94; Potassium 5.1; Sodium 137  Recent Lipid Panel    Component Value Date/Time   CHOL 146 10/24/2022 1714   TRIG 162 (H) 10/24/2022 1714   HDL 54 10/24/2022 1714   CHOLHDL 2.7 10/24/2022 1714   LDLCALC 65 10/24/2022 1714    Physical Exam:    VS:  There were no vitals taken for this visit.    Wt Readings from Last 3 Encounters:  10/24/22 173 lb 6.4 oz (78.7 kg)  02/28/22 170 lb (77.1 kg)  04/11/21 156 lb 1.3 oz (70.8 kg)     GEN: *** Well nourished, well developed in no acute distress HEENT: Normal NECK: No JVD; No carotid bruits LYMPHATICS: No lymphadenopathy CARDIAC: ***RRR, no murmurs, rubs, gallops RESPIRATORY:  Clear to auscultation without rales, wheezing or rhonchi  ABDOMEN: Soft, non-tender, non-distended MUSCULOSKELETAL:  No edema; No deformity  SKIN: Warm and dry NEUROLOGIC:  Alert and oriented x 3 PSYCHIATRIC:  Normal affect    Signed, Norman Herrlich, MD  04/08/2023 12:25 PM    [facility]

## 2023-04-09 ENCOUNTER — Ambulatory Visit: Payer: BC Managed Care – PPO | Admitting: Cardiology

## 2023-04-21 ENCOUNTER — Telehealth: Payer: Self-pay | Admitting: Cardiology

## 2023-04-21 ENCOUNTER — Other Ambulatory Visit: Payer: Self-pay

## 2023-04-21 DIAGNOSIS — R002 Palpitations: Secondary | ICD-10-CM

## 2023-04-21 NOTE — Telephone Encounter (Signed)
Called patient and she reported that it felt like her heart was doing flips in her chest and she was dizzy and at times SOB. She also stated that she had a lot of anxiety in her life at this time. Spoke to Dr. Dulce Sellar regarding the patient's symptoms and he recommended setting up a monitor visit to have her wear a zio monitor for 1 week and then to schedule an appointment for her to see Dr. Dulce Sellar. Informed the patient of Dr. Hulen Shouts recommendation and she was agreeable with this plan. Patient had no further questions at this time.

## 2023-04-21 NOTE — Telephone Encounter (Signed)
Patient c/o Palpitations:  High priority if patient c/o lightheadedness, shortness of breath, or chest pain  How long have you had palpitations/irregular HR/ Afib? Are you having the symptoms now? Going on since covid and was hospitalized for 4 days but it's gotten pretty bad now and she's having some SOB now.   Are you currently experiencing lightheadedness, SOB or CP? Dizziness, SOB and chest discomfort at times   Do you have a history of afib (atrial fibrillation) or irregular heart rhythm? Yes and has Hypertension  Have you checked your BP or HR? (document readings if available): No  Are you experiencing any other symptoms? Headaches are very bad now.

## 2023-04-22 NOTE — Telephone Encounter (Signed)
Please schedule monitor visit for 1 week zio monitor and then office visit with Dr. Dulce Sellar 4 weeks after monitor visit. Thanks.

## 2023-04-22 NOTE — Telephone Encounter (Signed)
LVM for pt to schedule monitor visit and 4 week follow up with bjm/kbl 04/22/23

## 2023-04-22 NOTE — Telephone Encounter (Signed)
Patient states she will be out of town and would like to just get the monitor put on Monday during her visit. She will also schedule her follow up then  Thank you

## 2023-04-22 NOTE — Telephone Encounter (Signed)
Patient is returning call. She states she spoke with someone but the call was disconnected.

## 2023-04-23 ENCOUNTER — Ambulatory Visit: Payer: BC Managed Care – PPO

## 2023-04-25 ENCOUNTER — Other Ambulatory Visit: Payer: Self-pay

## 2023-04-25 DIAGNOSIS — G8929 Other chronic pain: Secondary | ICD-10-CM | POA: Insufficient documentation

## 2023-04-28 ENCOUNTER — Ambulatory Visit: Payer: BC Managed Care – PPO

## 2023-04-28 VITALS — BP 120/70 | HR 72 | Ht 62.0 in | Wt 157.6 lb

## 2023-04-28 DIAGNOSIS — R002 Palpitations: Secondary | ICD-10-CM

## 2023-04-28 DIAGNOSIS — I10 Essential (primary) hypertension: Secondary | ICD-10-CM

## 2023-04-28 MED ORDER — ATORVASTATIN CALCIUM 80 MG PO TABS: 80.0000 mg | ORAL_TABLET | Freq: Every day | ORAL | 3 refills | Status: AC

## 2023-04-28 MED ORDER — LOSARTAN POTASSIUM 50 MG PO TABS: 50.0000 mg | ORAL_TABLET | Freq: Every evening | ORAL | 3 refills | Status: AC

## 2023-04-28 MED ORDER — PROPRANOLOL HCL 10 MG PO TABS: 10.0000 mg | ORAL_TABLET | Freq: Three times a day (TID) | ORAL | 3 refills | Status: AC

## 2023-04-28 NOTE — Assessment & Plan Note (Signed)
Her symptoms appear to be chronic and persistent. Occurring on a daily basis. No syncope.  Will proceed with transthoracic echocardiogram for structural and functional assessment.  Will defer to PCP to recheck her thyroid parameters given that she lost over 20 pounds in the past 10 months and adjust the dose of levothyroxine as needed.  Titrate up propranolol to 10 mg 3 times daily.  Will obtain a 72-hour Zio patch  Return to clinic based on test results.

## 2023-04-28 NOTE — Assessment & Plan Note (Signed)
Blood pressure is well-controlled. She requests refills on propranolol, losartan and atorvastatin. Will send the refills. Continue to target blood pressures below 130 over 80 mmHg.

## 2023-04-28 NOTE — Progress Notes (Signed)
Cardiology Consultation:    Date:  04/28/2023   ID:  Alexis Baker, DOB 01-Feb-1971, MRN [id number]  PCP:  [facility]  Cardiologist:  Luretha Murphy, MD   Referring MD: [facility]   No chief complaint on file. Palpitations  History of Present Illness:    Alexis Baker is a 52 y.o. female who is being seen today for the evaluation of palpitations at the request of [facility]   Her last visit in our office was October 24, 2022 with Dr. Dulce Sellar.  Has history of diabetes mellitus type 2, hypertension, hyperlipidemia, hypothyroidism, obstructive sleep apnea (not on treatment), evaluation for CAD with coronary calcium score in August 2022 score was 0, and more recently CTA coronary angiogram March 2024 showed right dominant coronary circulation with no evidence of CAD.she had COVID-related pneumonia in the past year, palpitations since COVID infection.  She previously has not treated event monitor in August 2022 that noted no significant abnormalities and average heart rate of 62/min.  Last echocardiogram to review is from May 2021 with LVEF 60 to 65%, grade 2 diastolic dysfunction, normal RV function  Lives at home with her husband and son.  She is dealing with significant emotional stressors at home according to her son's diagnosis of ADHD.  She herself has been dealing with longstanding symptoms of post-COVID.  Describes sensations of palpitations precipitated by anxiety, persistent throughout the day. Disturbs her sleep. Describes this as a heavy sensation in the chest over the past year since her COVID illness.  Modifying her diet she has lost 21 pounds over the last 8 to 10 months. No regular exercise.  No syncope, near syncope, lightheadedness,..  She does not smoke. She does consume caffeinated soda 2-3 times a day. Drinks lemonade intake. Does not consume alcohol.    Past Medical History:  Diagnosis Date    Chest pain of uncertain etiology 01/13/2020   Chronic pain    COVID-19 09/04/2020   CVA (cerebral vascular accident) (HCC) 2016   Depression    Diabetes mellitus due to underlying condition with hyperosmolarity without coma, without long-term current use of insulin (HCC) 01/13/2020   Dizziness 01/13/2020   Essential hypertension 03/03/2014   ETD (Eustachian tube dysfunction), bilateral 04/17/2021   Last Assessment & Plan:    Follow-up ear fullness and some discomfort with recurring right ear drainage.   Chronic eustachian tube history status post tubes several times in the past.   EXAM shows right tube in good position without drainage.  Tube appears to be patent.  Left tympanic membrane is normal with some movement on insufflation.   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   Ascorbic Acid 500 MG CAPS Take 1 capsule by mouth daily.   CHELATED ZINC PO Take 22 mg by mouth daily.   Coenzyme Q10 (CO Q-10) 100 MG CAPS Take 1 capsule by mouth daily.   cyclobenzaprine (FLEXERIL) 10 MG tablet Take 10 mg by mouth at bedtime.   FLUoxetine (PROZAC) 20 MG tablet Take 20 mg by mouth daily.    Fluticasone-Umeclidin-Vilant (TRELEGY ELLIPTA) 200-62.5-25 MCG/INH AEPB Inhale 1 puff into the lungs daily.   glyBURIDE (DIABETA) 5 MG tablet Take 5 mg by mouth 2 (two) times daily.   hydrOXYzine (ATARAX/VISTARIL) 25 MG tablet Take 25 mg by mouth as needed.   Insulin NPH Isophane & Regular (NOVOLIN 70/30 Kirby) Inject into the skin.   Lasmiditan Succinate (REYVOW) 100 MG TABS TAKE 1 TABLET BY MOUTH AS NEEDED FOR MIGRAINE, MAX 1 TABLET PER 24 HOURS   levothyroxine (SYNTHROID) 25 MCG tablet Take 25 mcg by mouth daily.   MAGNESIUM GLYCINATE PO Take 2 tablets by mouth at bedtime.   mometasone (NASONEX) 50 MCG/ACT nasal spray Place 2 sprays into the nose daily as needed (allergies).   montelukast (SINGULAIR) 10 MG tablet Take 10 mg by mouth at bedtime.   nitroGLYCERIN (NITROSTAT) 0.4 MG SL tablet PLACE 1 TABLET UNDER THE TONGUE EVERY 5 MINUTES AS NEEDED FOR CHEST PAIN.   omeprazole (PRILOSEC) 40 MG capsule Take 40 mg by mouth daily.   Probiotic Product (FORTIFY PROBIOTIC WOMENS PO) Take 1 tablet by mouth daily.   traZODone (DESYREL) 50 MG tablet Take 100 mg by mouth at bedtime.   [DISCONTINUED] atorvastatin (LIPITOR) 80 MG tablet Take 80 mg by mouth daily.   [DISCONTINUED] losartan (COZAAR) 50 MG tablet Take 50 mg by mouth at bedtime.   [DISCONTINUED] propranolol (INDERAL) 10 MG tablet Take 10 mg by mouth 3 (three) times daily.   Current Facility-Administered Medications for the 04/28/23 encounter (Office Visit) with Brette Cast, Marlyn Corporal, MD  Medication   betamethasone acetate-betamethasone sodium phosphate (CELESTONE) injection 12 mg   bupivacaine (PF) (MARCAINE) 0.25 % injection 8 mL     Allergies:   Codeine and Sulfa antibiotics   Social History   Socioeconomic History   Marital status: Unknown    Spouse name: Not on file   Number of children: Not on file   Years of education: Not on file   Highest education level: Not on file  Occupational History   Not on file  Tobacco Use   Smoking  status: Never   Smokeless tobacco: Never  Substance and Sexual Activity   Alcohol use: Never   Drug use: Never   Sexual activity: Not on file  Other Topics Concern   Not on file  Social History Narrative   Not on file   Social Determinants of Health   Financial Resource Strain: Low Risk  (09/04/2020)   Received from [facility], [facility]   Overall Financial Resource Strain (CARDIA)    Difficulty of Paying Living Expenses: Not hard at all  Food Insecurity: No Food Insecurity (09/04/2020)   Received from [facility], [facility]   Hunger Vital Sign    Worried About Running Out of Food in the Last Year: Never true    Ran Out of Food in the Last Year: Never true  Transportation Needs: No Transportation Needs (09/04/2020)   Received from [facility], [facility]   [facility] - Transportation  Lack of Transportation (Medical): No    Lack of Transportation (Non-Medical): No  Physical Activity: Insufficiently Active (09/04/2020)   Received from [facility], [facility]   Exercise Vital Sign    Days of Exercise per Week: 2 days    Minutes of Exercise per Session: 30 min  Stress: Stress Concern Present (09/04/2020)   Received from [facility], [facility] - Occupational Stress Questionnaire    Feeling of Stress : To some extent  Social Connections: Moderately Integrated (09/04/2020)   Received from [facility], [facility]   Social Connection and Isolation Panel [NHANES]    Frequency of Communication with Friends and Family: More than three times a week    Frequency of Social Gatherings with Friends and Family: Three times a week    Attends Religious Services: More than 4 times per year    Active Member of Clubs or Organizations: Yes    Attends Banker Meetings: Never    Marital Status: Separated     Family History: The patient's family history includes Breast cancer in her  cousin and paternal aunt; Congestive Heart Failure in her brother; Dementia in her paternal grandmother; Diabetes in her brother, father, maternal grandmother, and paternal grandmother; Heart attack in her maternal grandmother; Heart disease in her father. ROS:   Please see the history of present illness.    All 14 point review of systems negative except as described per history of present illness.  EKGs/Labs/Other Studies Reviewed:    The following studies were reviewed today:   EKG:  EKG Interpretation Date/Time:  Monday April 28 2023 15:00:14 EDT Ventricular Rate:  67 PR Interval:  138 QRS Duration:  68 QT Interval:  398 QTC Calculation: 420 R Axis:   67  Text Interpretation: Normal sinus rhythm Normal ECG When compared with ECG of 19-Aug-2006 14:26, Nonspecific T wave abnormality has replaced inverted T waves in Anterior leads Confirmed by Huntley Dec reddy [phone]) on 04/28/2023 3:08:31 PM    Recent Labs: 10/24/2022: BUN 12; Creatinine, Ser 0.94; Potassium 5.1; Sodium 137  Recent Lipid Panel    Component Value Date/Time   CHOL 146 10/24/2022 1714   TRIG 162 (H) 10/24/2022 1714   HDL 54 10/24/2022 1714   CHOLHDL 2.7 10/24/2022 1714   LDLCALC 65 10/24/2022 1714   TSH 3.77, normal on 11/05/2022  Physical Exam:    VS:  BP 120/70   Pulse 72   Ht 5\' 2"  (1.575 m)   Wt 157 lb 9.6 oz (71.5 kg)   SpO2 95%   BMI 28.83 kg/m     Wt Readings from Last 3 Encounters:  04/28/23 157 lb 9.6 oz (71.5 kg)  10/24/22 173 lb 6.4 oz (78.7 kg)  02/28/22 170 lb (77.1 kg)     GENERAL:  Well nourished, well developed in no acute distress NECK: No JVD; No carotid bruits CARDIAC: RRR, S1 and S2 present, no murmurs, no rubs, no gallops CHEST:  Clear to auscultation without rales, wheezing or rhonchi  Extremities: No pitting pedal edema. Pulses bilaterally symmetric with radial 2+ and dorsalis pedis 2+ NEUROLOGIC:  Alert and oriented x 3   ASSESSMENT AND PLAN:   52 year old woman  with history of diabetes mellitus, hypertension, hyperlipidemia, hypothyroidism, obstructive sleep apnea currently not on treatment has been dealing with symptoms of chest discomfort, palpitations since her COVID-pneumonia diagnosis late last year.  She has also undergone CAD workup since then  with a CTA coronary angiogram that was unremarkable in March 2024.  No recent echocardiogram since her COVID diagnosis.  Problem List Items Addressed This Visit     Essential hypertension - Primary    Blood pressure is well-controlled. She requests refills on propranolol, losartan and atorvastatin. Will send the refills. Continue to target blood pressures below 130 over 80 mmHg.      Relevant Medications   propranolol (INDERAL) 10 MG tablet   atorvastatin (LIPITOR) 80 MG tablet   losartan (COZAAR) 50 MG tablet   Palpitations    Her symptoms appear to be chronic and persistent. Occurring on a daily basis. No syncope.  Will proceed with transthoracic echocardiogram for structural and functional assessment.  Will defer to PCP to recheck her thyroid parameters given that she lost over 20 pounds in the past 10 months and adjust the dose of levothyroxine as needed.  Titrate up propranolol to 10 mg 3 times daily.  Will obtain a 72-hour Zio patch  Return to clinic based on test results.       Relevant Orders   ECHOCARDIOGRAM COMPLETE   LONG TERM MONITOR (3-14 DAYS)   EKG 12-Lead (Completed)     Medication Adjustments/Labs and Tests Ordered: Current medicines are reviewed at length with the patient today.  Concerns regarding medicines are outlined above.  Orders Placed This Encounter  Procedures   LONG TERM MONITOR (3-14 DAYS)   EKG 12-Lead   ECHOCARDIOGRAM COMPLETE   Meds ordered this encounter  Medications   propranolol (INDERAL) 10 MG tablet    Sig: Take 1 tablet (10 mg total) by mouth 3 (three) times daily.    Dispense:  270 tablet    Refill:  3   atorvastatin (LIPITOR) 80 MG  tablet    Sig: Take 1 tablet (80 mg total) by mouth daily.    Dispense:  90 tablet    Refill:  3   losartan (COZAAR) 50 MG tablet    Sig: Take 1 tablet (50 mg total) by mouth at bedtime.    Dispense:  90 tablet    Refill:  3    Signed, Dan Scearce reddy Sevastian Witczak, MD, MPH, [facility]. 04/28/2023 3:14 PM    [facility]

## 2023-04-28 NOTE — Patient Instructions (Addendum)
Medication Instructions:  Increase Propranolol to 10 mg 3 times a day   *If you need a refill on your cardiac medications before your next appointment, please call your pharmacy*   Lab Work: None ordered   If you have labs (blood work) drawn today and your tests are completely normal, you will receive your results only by: MyChart Message (if you have MyChart) OR A paper copy in the mail If you have any lab test that is abnormal or we need to change your treatment, we will call you to review the results.   Testing/Procedures: Your physician has requested that you have an echocardiogram. Echocardiography is a painless test that uses sound waves to create images of your heart. It provides your doctor with information about the size and shape of your heart and how well your heart's chambers and valves are working. This procedure takes approximately one hour. There are no restrictions for this procedure. Please do NOT wear cologne, perfume, aftershave, or lotions (deodorant is allowed). Please arrive 15 minutes prior to your appointment time.  A zio monitor was ordered today. It will remain on for 3 days. You will then return monitor and event diary in provided box. It takes 1-2 weeks for report to be downloaded and returned to [place]. We will call you with the results. If monitor falls off or has orange flashing light, please call Zio for further instructions.     Follow-Up: Follow up based on test results   Other Instructions ZIO XT- Long Term Monitor Instructions  Your physician has requested you wear a ZIO patch monitor for 3 days.  This is a single patch monitor. [employer] supplies one patch monitor per enrollment. Additional stickers are not available. Please do not apply patch if you will be having a Nuclear Stress Test,  Echocardiogram, Cardiac CT, MRI, or Chest Xray during the period you would be wearing the  monitor. The patch cannot be worn during these tests. You cannot remove and  re-apply the  ZIO XT patch monitor.  Your ZIO patch monitor will be mailed 3 day USPS to your address on file. It may take 3-5 days  to receive your monitor after you have been enrolled.  Once you have received your monitor, please review the enclosed instructions. Your monitor  has already been registered assigning a specific monitor serial # to you.  Billing and Patient Assistance Program Information  We have supplied [employer] with any of your insurance information on file for billing purposes. [employer] offers a sliding scale Patient Assistance Program for patients that do not have  insurance, or whose insurance does not completely cover the cost of the ZIO monitor.  You must apply for the Patient Assistance Program to qualify for this discounted rate.  To apply, please call [employer] at [phone], select option 4, select option 2, ask to apply for  Patient Assistance Program. Meredeth Ide will ask your household income, and how many people  are in your household. They will quote your out-of-pocket cost based on that information.  [employer] will also be able to set up a 69-month, interest-free payment plan if needed.  Applying the monitor   Shave hair from upper left chest.  Hold abrader disc by orange tab. Rub abrader in 40 strokes over the upper left chest as  indicated in your monitor instructions.  Clean area with 4 enclosed alcohol pads. Let dry.  Apply patch as indicated in monitor instructions. Patch will be placed under collarbone on left  side  of chest with arrow pointing upward.  Rub patch adhesive wings for 2 minutes. Remove white label marked "1". Remove the white  label marked "2". Rub patch adhesive wings for 2 additional minutes.  While looking in a mirror, press and release button in center of patch. A small green light will  flash 3-4 times. This will be your only indicator that the monitor has been turned on.  Do not shower for the first 24 hours. You may shower after the  first 24 hours.  Press the button if you feel a symptom. You will hear a small click. Record Date, Time and  Symptom in the Patient Logbook.  When you are ready to remove the patch, follow instructions on the last 2 pages of Patient  Logbook. Stick patch monitor onto the last page of Patient Logbook.  Place Patient Logbook in the blue and white box. Use locking tab on box and tape box closed  securely. The blue and white box has prepaid postage on it. Please place it in the mailbox as  soon as possible. Your physician should have your test results approximately 7 days after the  monitor has been mailed back to [facility].  Call [facility] Customer Care at [phone] if you have questions regarding  your ZIO XT patch monitor. Call them immediately if you see an orange light blinking on your  monitor.  If your monitor falls off in less than 4 days, contact our Monitor department at [phone].  If your monitor becomes loose or falls off after 4 days call [employer] at [phone] for  suggestions on securing your monitor

## 2023-05-05 ENCOUNTER — Ambulatory Visit: Payer: BC Managed Care – PPO

## 2023-05-06 ENCOUNTER — Telehealth: Payer: Self-pay

## 2023-05-06 NOTE — Telephone Encounter (Signed)
Left message on My Chart with normal results per Dr. MAdireddy's note. Routed to PCP.

## 2023-05-07 ENCOUNTER — Ambulatory Visit (INDEPENDENT_AMBULATORY_CARE_PROVIDER_SITE_OTHER): Payer: BC Managed Care – PPO

## 2023-05-07 VITALS — BP 122/80 | Ht 62.0 in | Wt 158.0 lb

## 2023-05-07 DIAGNOSIS — N898 Other specified noninflammatory disorders of vagina: Secondary | ICD-10-CM

## 2023-05-07 DIAGNOSIS — N6342 Unspecified lump in left breast, subareolar: Secondary | ICD-10-CM | POA: Diagnosis not present

## 2023-05-07 DIAGNOSIS — N9089 Other specified noninflammatory disorders of vulva and perineum: Secondary | ICD-10-CM

## 2023-05-07 MED ORDER — ESTRADIOL 0.1 MG/GM VA CREA
0.2500 | TOPICAL_CREAM | Freq: Every day | VAGINAL | 3 refills | Status: AC
Start: 2023-05-07 — End: 2024-05-06

## 2023-05-07 NOTE — Progress Notes (Signed)
GYN ENCOUNTER  Encounter for breast concerns  Subjective  HPI: Alexis Baker is a 52 y.o. G1P1001 who presents today for concern about possible lump in left breast.   Approximately 4-6 weeks ago, patient noted a hard knot under her left nipple that was also tender and non-mobile. It has not changed in size since that time. She had a mammogram in June of 2023 for breast pain in her left breast that found nothing malignant at the time. She is not having regular periods but has not fully entered menopause at this time.  She is also concerned about pain with intercourse due to vaginal dryness. She has tried using additional lubrication but it has not been very helpful.   Past Medical History:  Diagnosis Date   Chest pain of uncertain etiology 01/13/2020   Chronic pain    COVID-19 09/04/2020   CVA (cerebral vascular accident) (HCC) 2016   Depression    Diabetes mellitus due to underlying condition with hyperosmolarity without coma, without long-term current use of insulin (HCC) 01/13/2020   Dizziness 01/13/2020   Essential hypertension 03/03/2014   ETD (Eustachian tube dysfunction), bilateral 04/17/2021   Last Assessment & Plan:    Follow-up ear fullness and some discomfort with recurring right ear drainage.   Chronic eustachian tube history status post tubes several times in the past.   EXAM shows right tube in good position without drainage.  Tube appears to be patent.  Left tympanic membrane is normal with some movement on insufflation.   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   Mixed hyperlipidemia 01/13/2020    Neck pain 02/26/2012   Obesity 09/04/2020   Palpitations 01/13/2020   Shortness of breath 01/13/2020   Thyroid disease    Past Surgical History:  Procedure Laterality Date   INNER EAR SURGERY     Tubes placed in right   TONSILLECTOMY AND ADENOIDECTOMY     TUBAL LIGATION     OB History     Gravida  1   Para  1   Term  1   Preterm      AB      Living  1      SAB      IAB      Ectopic      Multiple      Live Births             Allergies  Allergen Reactions   Codeine    Sulfa Antibiotics     Review of Systems  12 point ROS negative except for pertinent positives noted in HPO above.   Objective  BP 122/80   Ht 5\' 2"  (1.575 m)   Wt 158 lb (71.7 kg)   LMP 05/03/2023   BMI 28.90 kg/m   Physical examination GENERAL APPEARANCE: alert, well appearing LUNGS: normal work of breathing HEART: normal heart rate     BREAST: small tender nodule noted under left nipple, tenderness around nipple area on both breasts. Fibrocystic nodules noted throughout breast.  Assessment/Plan - Dyspareunia due vaginal dryness. Estrace cream prescribed, reviewed use and when to follow up with ongoing concerns. -  Possible breast mass, mammogram ordered.   Lindalou Hose Jonda Alanis, CNM  05/07/23 4:38 PM

## 2023-05-10 ENCOUNTER — Other Ambulatory Visit: Payer: Self-pay

## 2023-05-10 DIAGNOSIS — N6342 Unspecified lump in left breast, subareolar: Secondary | ICD-10-CM

## 2023-05-16 ENCOUNTER — Other Ambulatory Visit: Payer: Self-pay

## 2023-05-16 DIAGNOSIS — R928 Other abnormal and inconclusive findings on diagnostic imaging of breast: Secondary | ICD-10-CM

## 2023-05-21 ENCOUNTER — Ambulatory Visit: Payer: BC Managed Care – PPO | Admitting: Obstetrics

## 2023-05-26 ENCOUNTER — Ambulatory Visit
Admission: RE | Admit: 2023-05-26 | Discharge: 2023-05-26 | Disposition: A | Discharge status: Home / Self Care | Payer: BC Managed Care – PPO | Source: Ambulatory Visit

## 2023-05-26 DIAGNOSIS — N6342 Unspecified lump in left breast, subareolar: Secondary | ICD-10-CM | POA: Insufficient documentation

## 2023-05-30 ENCOUNTER — Ambulatory Visit: Payer: BC Managed Care – PPO

## 2023-07-03 ENCOUNTER — Ambulatory Visit: Payer: BC Managed Care – PPO

## 2023-07-03 DIAGNOSIS — R002 Palpitations: Secondary | ICD-10-CM | POA: Diagnosis not present

## 2023-07-03 LAB — ECHOCARDIOGRAM COMPLETE
Area-P 1/2: 4.74 cm2
S' Lateral: 2.7 cm

## 2023-08-11 ENCOUNTER — Encounter: Payer: Medicaid Other | Admitting: Urology

## 2023-08-11 IMAGING — MG DIGITAL DIAGNOSTIC BILAT W/ TOMO W/ CAD
8 of 14 series · 8 of 40 positions shown · non-contrast
Comparison: Previous exam(s).

CLINICAL DATA: Here for evaluation of a palpable and sore area felt
by the patient in the lower left breast as well as an area of
intermittent focal pain and thickening in the lower inner left
breast.

EXAM:
DIGITAL DIAGNOSTIC BILATERAL MAMMOGRAM WITH TOMOSYNTHESIS AND CAD;
ULTRASOUND LEFT BREAST LIMITED
TECHNIQUE: Bilateral digital diagnostic mammography and breast tomosynthesis
was performed. The images were evaluated with computer-aided
detection.; Targeted ultrasound examination of the left breast was
performed.

[R CC synth-2D]
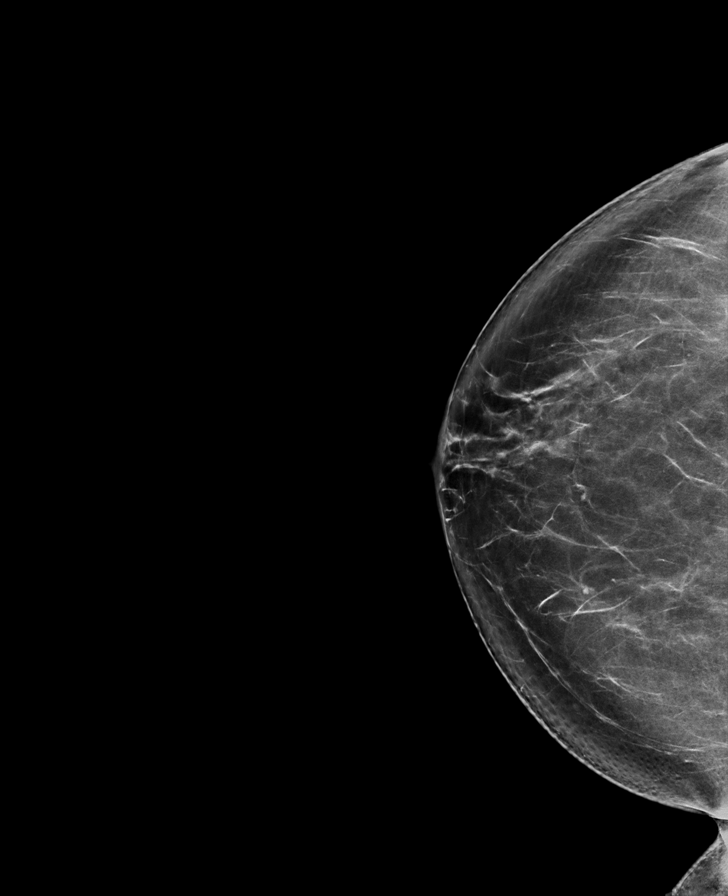

[L MLO synth-2D (1 of 3)]
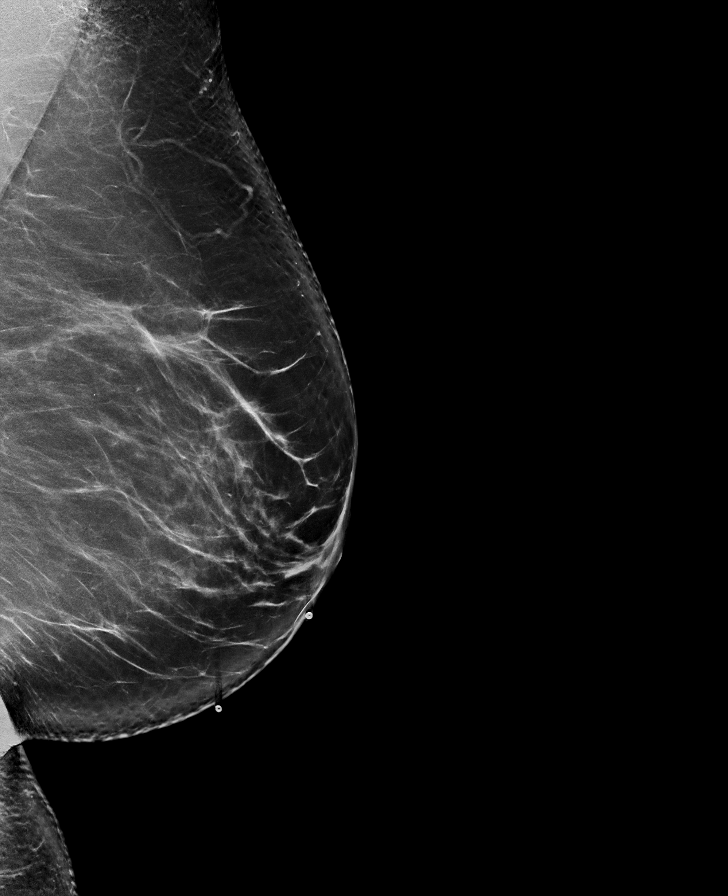

[L CC synth-2D]
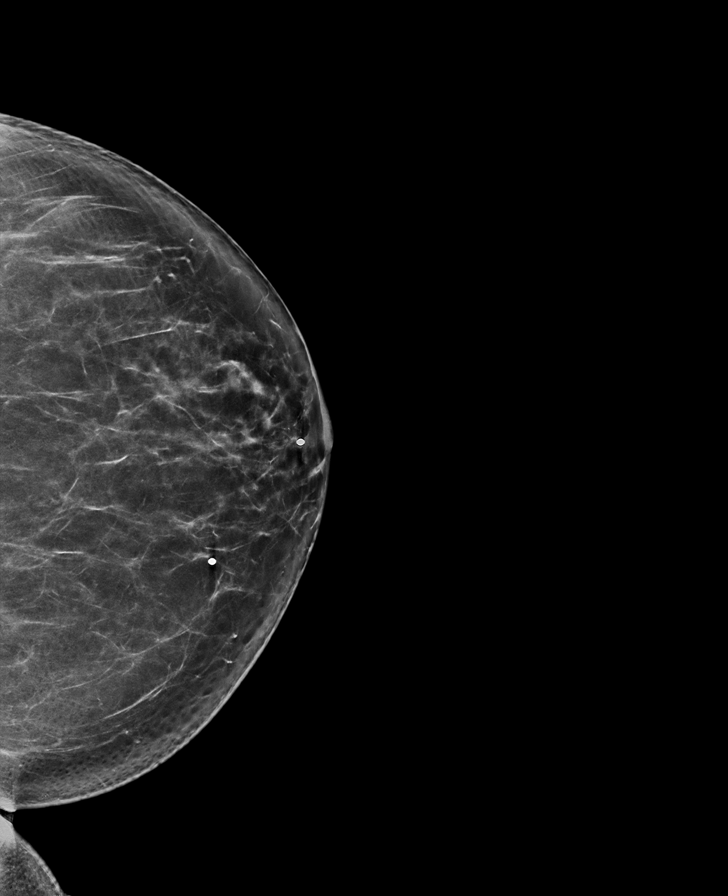

[L MLO synth-2D (2 of 3)]
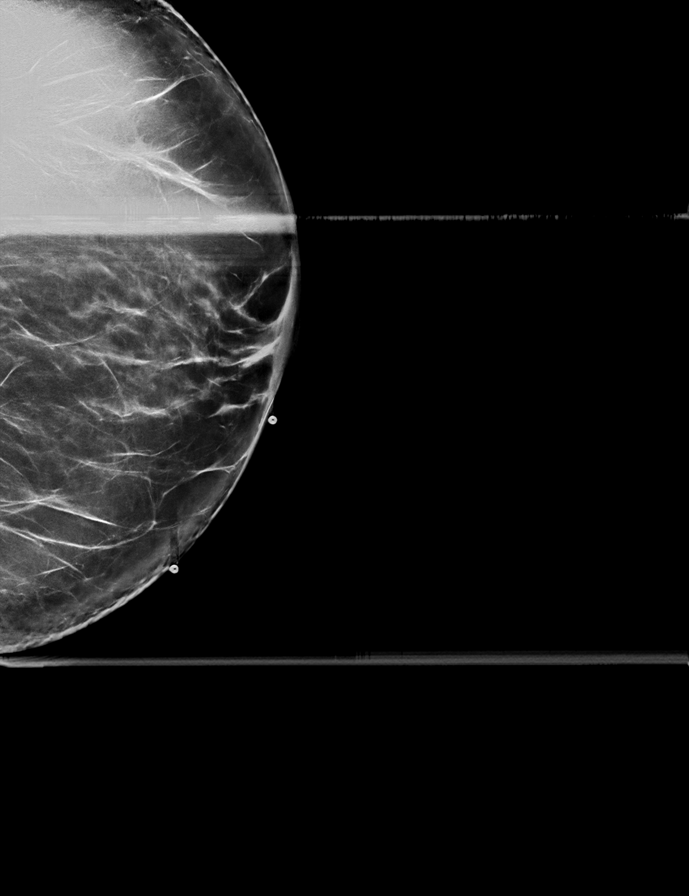

[R MLO synth-2D (1 of 2)]
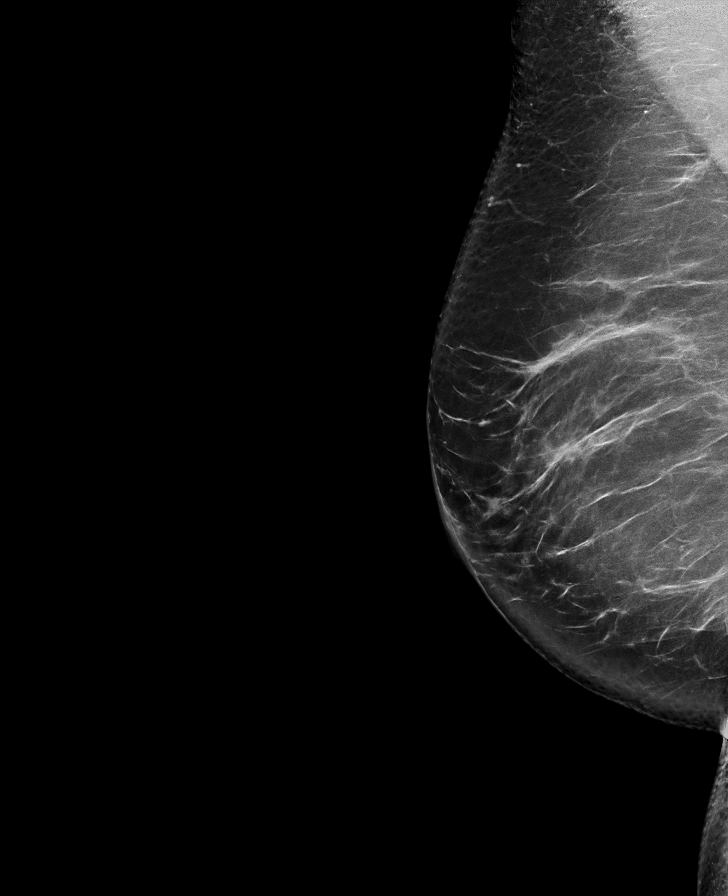

[R MLO synth-2D (2 of 2)]
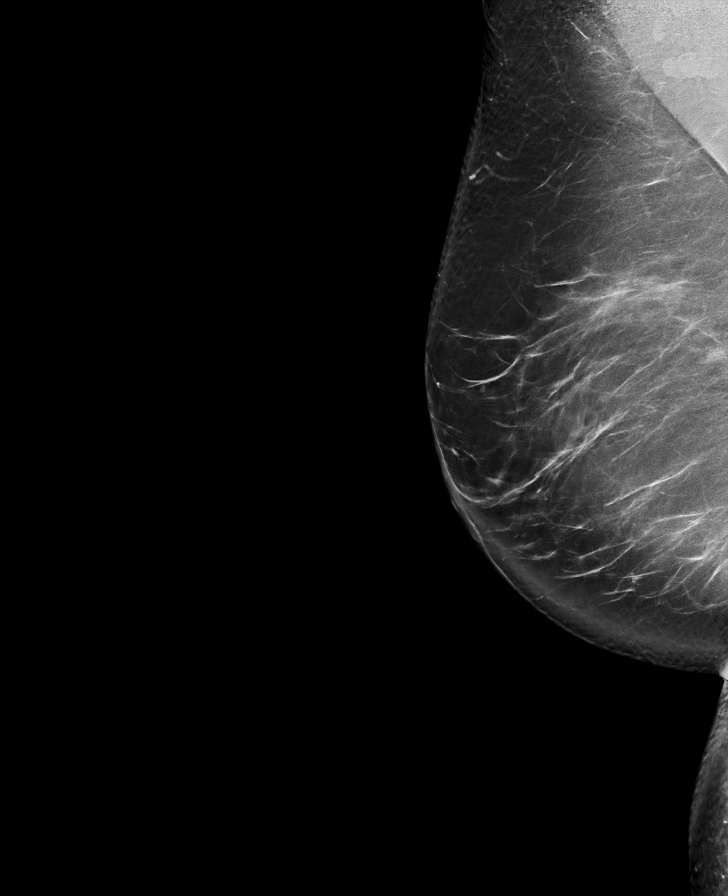

[L MLO synth-2D (3 of 3)]
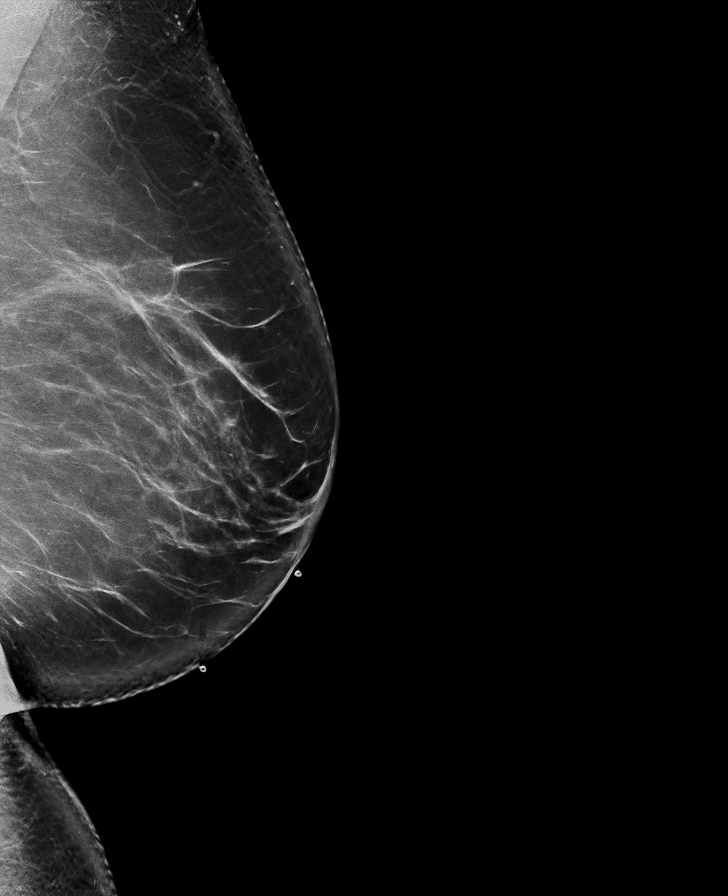

[R MLO tomo · tomo slice 49/98.0]
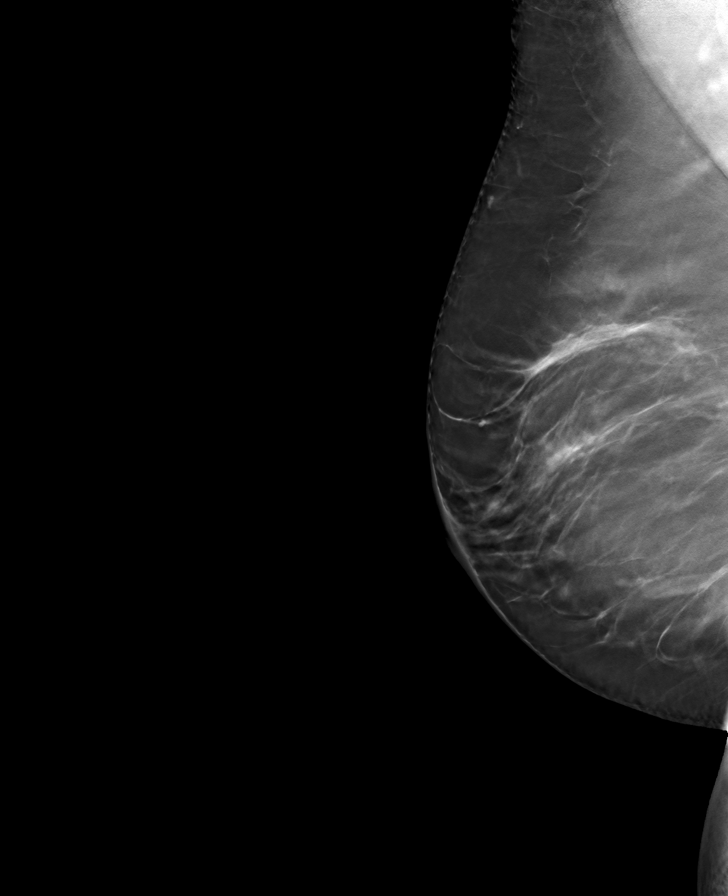

[8 of 40 positions shown; findings below may reference images not displayed]

ACR Breast Density Category b: There are scattered areas of
fibroglandular density.
FINDINGS: Spot compression was obtained over the areas of concern in the left
breast. No suspicious mammographic finding is identified in this
area. No suspicious mass, microcalcification, or other finding is
identified in either breast.

Targeted left breast ultrasound was performed by the sonographer
areas of concern from the 6 o'clock to the 8 o'clock position. No
suspicious solid or cystic mass is identified. Normal-appearing
fibroglandular tissue is seen. No findings to explain the patient's
symptoms.
IMPRESSION: No evidence of malignancy or findings to explain the patient's
symptoms.

RECOMMENDATION:
Any further workup of the patient's symptoms should be based on the
clinical assessment. Recommend routine annual screening mammogram in
1 year.

I have discussed the findings and recommendations with the patient.
If applicable, a reminder letter will be sent to the patient
regarding the next appointment.

BI-RADS CATEGORY  1: Negative.

## 2023-10-16 ENCOUNTER — Other Ambulatory Visit: Payer: Self-pay

## 2023-10-21 ENCOUNTER — Ambulatory Visit: Payer: BC Managed Care – PPO

## 2024-05-28 ENCOUNTER — Ambulatory Visit: Admitting: Diagnostic Neuroimaging

## 2024-10-27 ENCOUNTER — Ambulatory Visit: Admitting: Neurology
